# Patient Record
Sex: Female | Born: 1940 | Race: Black or African American | Hispanic: No | Smoking: Never smoker
Health system: Southern US, Community
[De-identification: ages and names within clinical notes are randomized; demographics above are authoritative.]

## PROBLEM LIST (undated history)

## (undated) DIAGNOSIS — M542 Cervicalgia: Secondary | ICD-10-CM

## (undated) DIAGNOSIS — E119 Type 2 diabetes mellitus without complications: Secondary | ICD-10-CM

## (undated) DIAGNOSIS — H409 Unspecified glaucoma: Secondary | ICD-10-CM

## (undated) DIAGNOSIS — I639 Cerebral infarction, unspecified: Secondary | ICD-10-CM

## (undated) DIAGNOSIS — L89892 Pressure ulcer of other site, stage 2: Secondary | ICD-10-CM

## (undated) DIAGNOSIS — Z993 Dependence on wheelchair: Secondary | ICD-10-CM

## (undated) DIAGNOSIS — I10 Essential (primary) hypertension: Secondary | ICD-10-CM

## (undated) DIAGNOSIS — G4700 Insomnia, unspecified: Secondary | ICD-10-CM

## (undated) DIAGNOSIS — N189 Chronic kidney disease, unspecified: Secondary | ICD-10-CM

## (undated) DIAGNOSIS — R32 Unspecified urinary incontinence: Secondary | ICD-10-CM

## (undated) DIAGNOSIS — R413 Other amnesia: Secondary | ICD-10-CM

## (undated) DIAGNOSIS — R300 Dysuria: Secondary | ICD-10-CM

## (undated) DIAGNOSIS — F0390 Unspecified dementia without behavioral disturbance: Secondary | ICD-10-CM

## (undated) DIAGNOSIS — R569 Unspecified convulsions: Secondary | ICD-10-CM

## (undated) DIAGNOSIS — K5900 Constipation, unspecified: Secondary | ICD-10-CM

## (undated) DIAGNOSIS — I2510 Atherosclerotic heart disease of native coronary artery without angina pectoris: Secondary | ICD-10-CM

## (undated) HISTORY — PX: CARDIAC SURGERY: SHX584

## (undated) HISTORY — PX: CORONARY ARTERY BYPASS GRAFT: SHX141

---

## 2015-03-21 ENCOUNTER — Emergency Department: Payer: Self-pay | Admitting: Emergency Medicine

## 2015-03-21 LAB — URINALYSIS, COMPLETE
Bilirubin,UR: NEGATIVE
Blood: NEGATIVE
GLUCOSE, UR: NEGATIVE mg/dL (ref 0–75)
Hyaline Cast: 2
KETONE: NEGATIVE
Nitrite: NEGATIVE
Ph: 6 (ref 4.5–8.0)
Protein: NEGATIVE
SPECIFIC GRAVITY: 1.012 (ref 1.003–1.030)

## 2015-03-21 LAB — COMPREHENSIVE METABOLIC PANEL
ALBUMIN: 3.5 g/dL
Alkaline Phosphatase: 52 U/L
Anion Gap: 8 (ref 7–16)
BUN: 19 mg/dL
Bilirubin,Total: 0.9 mg/dL
CO2: 29 mmol/L
Calcium, Total: 9.5 mg/dL
Chloride: 102 mmol/L
Creatinine: 1.02 mg/dL — ABNORMAL HIGH
EGFR (Non-African Amer.): 55 — ABNORMAL LOW
Glucose: 184 mg/dL — ABNORMAL HIGH
Potassium: 3.7 mmol/L
SGOT(AST): 20 U/L
SGPT (ALT): 16 U/L
Sodium: 139 mmol/L
Total Protein: 7.6 g/dL

## 2015-03-21 LAB — CBC WITH DIFFERENTIAL/PLATELET
BASOS PCT: 0.9 %
Basophil #: 0 10*3/uL (ref 0.0–0.1)
EOS PCT: 1.5 %
Eosinophil #: 0.1 10*3/uL (ref 0.0–0.7)
HCT: 45.7 % (ref 35.0–47.0)
HGB: 15 g/dL (ref 12.0–16.0)
LYMPHS ABS: 1.2 10*3/uL (ref 1.0–3.6)
Lymphocyte %: 28.2 %
MCH: 29.5 pg (ref 26.0–34.0)
MCHC: 32.9 g/dL (ref 32.0–36.0)
MCV: 90 fL (ref 80–100)
MONO ABS: 0.3 x10 3/mm (ref 0.2–0.9)
Monocyte %: 6.1 %
Neutrophil #: 2.7 10*3/uL (ref 1.4–6.5)
Neutrophil %: 63.3 %
PLATELETS: 206 10*3/uL (ref 150–440)
RBC: 5.09 10*6/uL (ref 3.80–5.20)
RDW: 15.1 % — ABNORMAL HIGH (ref 11.5–14.5)
WBC: 4.3 10*3/uL (ref 3.6–11.0)

## 2015-03-21 LAB — TROPONIN I

## 2015-03-23 LAB — URINE CULTURE

## 2015-05-11 ENCOUNTER — Other Ambulatory Visit: Payer: Self-pay | Admitting: Internal Medicine

## 2015-05-11 DIAGNOSIS — M25562 Pain in left knee: Secondary | ICD-10-CM

## 2015-07-22 ENCOUNTER — Emergency Department: Payer: Medicare Other

## 2015-07-22 ENCOUNTER — Emergency Department
Admission: EM | Admit: 2015-07-22 | Discharge: 2015-07-22 | Disposition: A | Discharge status: Home / Self Care | Payer: Medicare Other | Attending: Emergency Medicine | Admitting: Emergency Medicine

## 2015-07-22 ENCOUNTER — Other Ambulatory Visit: Payer: Self-pay

## 2015-07-22 DIAGNOSIS — F0390 Unspecified dementia without behavioral disturbance: Secondary | ICD-10-CM | POA: Diagnosis not present

## 2015-07-22 DIAGNOSIS — N390 Urinary tract infection, site not specified: Secondary | ICD-10-CM | POA: Insufficient documentation

## 2015-07-22 DIAGNOSIS — Z79899 Other long term (current) drug therapy: Secondary | ICD-10-CM | POA: Insufficient documentation

## 2015-07-22 DIAGNOSIS — R4182 Altered mental status, unspecified: Secondary | ICD-10-CM | POA: Diagnosis present

## 2015-07-22 DIAGNOSIS — E119 Type 2 diabetes mellitus without complications: Secondary | ICD-10-CM | POA: Insufficient documentation

## 2015-07-22 DIAGNOSIS — R319 Hematuria, unspecified: Secondary | ICD-10-CM

## 2015-07-22 HISTORY — DX: Type 2 diabetes mellitus without complications: E11.9

## 2015-07-22 HISTORY — DX: Essential (primary) hypertension: I10

## 2015-07-22 HISTORY — DX: Other amnesia: R41.3

## 2015-07-22 HISTORY — DX: Cerebral infarction, unspecified: I63.9

## 2015-07-22 HISTORY — DX: Unspecified glaucoma: H40.9

## 2015-07-22 HISTORY — DX: Cervicalgia: M54.2

## 2015-07-22 HISTORY — DX: Constipation, unspecified: K59.00

## 2015-07-22 HISTORY — DX: Unspecified dementia, unspecified severity, without behavioral disturbance, psychotic disturbance, mood disturbance, and anxiety: F03.90

## 2015-07-22 LAB — CBC WITH DIFFERENTIAL/PLATELET
BASOS PCT: 1 %
Basophils Absolute: 0 10*3/uL (ref 0–0.1)
Eosinophils Absolute: 0 10*3/uL (ref 0–0.7)
Eosinophils Relative: 1 %
HCT: 41 % (ref 35.0–47.0)
Hemoglobin: 13.6 g/dL (ref 12.0–16.0)
LYMPHS PCT: 43 %
Lymphs Abs: 2.2 10*3/uL (ref 1.0–3.6)
MCH: 29.5 pg (ref 26.0–34.0)
MCHC: 33.1 g/dL (ref 32.0–36.0)
MCV: 89.2 fL (ref 80.0–100.0)
MONO ABS: 0.4 10*3/uL (ref 0.2–0.9)
MONOS PCT: 8 %
Neutro Abs: 2.5 10*3/uL (ref 1.4–6.5)
Neutrophils Relative %: 47 %
Platelets: 190 10*3/uL (ref 150–440)
RBC: 4.59 MIL/uL (ref 3.80–5.20)
RDW: 13 % (ref 11.5–14.5)
WBC: 5.2 10*3/uL (ref 3.6–11.0)

## 2015-07-22 LAB — COMPREHENSIVE METABOLIC PANEL
ALBUMIN: 3.5 g/dL (ref 3.5–5.0)
ALK PHOS: 39 U/L (ref 38–126)
ALT: 25 U/L (ref 14–54)
AST: 25 U/L (ref 15–41)
Anion gap: 6 (ref 5–15)
BUN: 23 mg/dL — ABNORMAL HIGH (ref 6–20)
CALCIUM: 9.4 mg/dL (ref 8.9–10.3)
CHLORIDE: 104 mmol/L (ref 101–111)
CO2: 32 mmol/L (ref 22–32)
CREATININE: 0.99 mg/dL (ref 0.44–1.00)
GFR calc Af Amer: >60 mL/min (ref >60)
GFR, EST NON AFRICAN AMERICAN: 56 mL/min — AB (ref >60)
Glucose, Bld: 158 mg/dL — ABNORMAL HIGH (ref 65–99)
Potassium: 3.2 mmol/L — ABNORMAL LOW (ref 3.5–5.1)
Sodium: 142 mmol/L (ref 135–145)
Total Bilirubin: 0.4 mg/dL (ref 0.3–1.2)
Total Protein: 6.9 g/dL (ref 6.5–8.1)

## 2015-07-22 LAB — URINALYSIS COMPLETE WITH MICROSCOPIC (ARMC ONLY)
Bilirubin Urine: NEGATIVE
Glucose, UA: 50 mg/dL — AB
HGB URINE DIPSTICK: NEGATIVE
Ketones, ur: NEGATIVE mg/dL
Leukocytes, UA: NEGATIVE
NITRITE: NEGATIVE
PROTEIN: 100 mg/dL — AB
SPECIFIC GRAVITY, URINE: 1.019 (ref 1.005–1.030)
pH: 7 (ref 5.0–8.0)

## 2015-07-22 LAB — GLUCOSE, CAPILLARY: Glucose-Capillary: 99 mg/dL (ref 65–99)

## 2015-07-22 LAB — TROPONIN I: Troponin I: 0.03 ng/mL (ref <0.031)

## 2015-07-22 MED ORDER — CEPHALEXIN 500 MG PO CAPS
500.0000 mg | ORAL_CAPSULE | Freq: Once | ORAL | Status: AC
Start: 2015-07-22 — End: 2015-07-22
  Administered 2015-07-22: 500 mg via ORAL
  Filled 2015-07-22: qty 1

## 2015-07-22 MED ORDER — CEPHALEXIN 500 MG PO CAPS: 500.0000 mg | ORAL_CAPSULE | Freq: Three times a day (TID) | ORAL | Status: DC

## 2015-07-22 MED ORDER — SODIUM CHLORIDE 0.9 % IV BOLUS (SEPSIS)
1000.0000 mL | Freq: Once | INTRAVENOUS | Status: AC
  Administered 2015-07-22: 1000 mL via INTRAVENOUS

## 2015-07-22 NOTE — ED Notes (Signed)
Pt brought to ED via EMS  Per EMS, pt was found unresponsive in home.  Per EMS, pt CGB was 71, they gave 1/2 amp D50.  Recheck CBG was 68, they gave rest of amp, pt became responsive en route. Pt is oriented to self, but not place or time.

## 2015-07-22 NOTE — ED Provider Notes (Signed)
[facility] Emergency Department Provider Note  ____________________________________________  Time seen: 9:25 AM on arrival by EMS  I have reviewed the triage vital signs and the nursing notes.   HISTORY  Chief Complaint Altered Mental Status  altered mental status History Limited by patient altered mental status and somnolence  HPI Debra Hoffman is a 74 y.o. female who was found at her nursing home to be unarousable this morning. EMS was called and they confirm that they were unable to wake the patient up despite noxious stimuli. They checked a blood sugar which was 71 and they gave one half amp of D50, and on recheck the blood sugar was 68 so they gave another one half amp of D50 during transport. Just prior to arrival in the hospital and at that she did open her eyes and begin to respond. No known recent illness falls or trauma. EMS report that the family did tell them that the patient has a DO NOT RESUSCITATE directive although they're unable to locate paperwork confirming this at this time.  Patient denies any specific complaints at this time denies any chest pain shortness of breath abdominal pain headache or vision change. She reports pain at her IV insertion site.   Past Medical History  Diagnosis Date  . Diabetes mellitus without complication   . Cervicalgia   . Glaucoma   . Hypertension   . Dementia without behavioral disturbance   . Stroke   . Memory loss   . Constipation     There are no active problems to display for this patient.   Past Surgical History  Procedure Laterality Date  . Cardiac surgery      coronary artery bypass graft    Current Outpatient Rx  Name  Route  Sig  Dispense  Refill  . carvedilol (COREG) 12.5 MG tablet   Oral   Take 12.5 mg by mouth 2 (two) times daily with a meal.         . clopidogrel (PLAVIX) 75 MG tablet   Oral   Take 75 mg by mouth daily.         . divalproex (DEPAKOTE ER) 250 MG 24 hr  tablet   Oral   Take 250 mg by mouth 2 (two) times daily.         . divalproex (DEPAKOTE) 500 MG DR tablet   Oral   Take 500 mg by mouth 2 (two) times daily.         Marland Kitchen donepezil (ARICEPT) 5 MG tablet   Oral   Take 5 mg by mouth at bedtime.         . hydrochlorothiazide (HYDRODIURIL) 25 MG tablet   Oral   Take 25 mg by mouth daily.         . insulin NPH-regular Human (HUMULIN 70/30) (70-30) 100 UNIT/ML injection   Subcutaneous   Inject 15 Units into the skin daily with breakfast.         . naproxen (NAPROSYN) 250 MG tablet   Oral   Take 250 mg by mouth 2 (two) times daily with a meal.         . nitroGLYCERIN (NITROSTAT) 0.4 MG SL tablet   Sublingual   Place 0.4 mg under the tongue every 5 (five) minutes as needed for chest pain.         . pantoprazole (PROTONIX) 40 MG tablet   Oral   Take 40 mg by mouth daily.         Marland Kitchen  potassium citrate (UROCIT-K) 10 MEQ (1080 MG) SR tablet   Oral   Take 10 mEq by mouth 3 (three) times daily with meals.         . rosuvastatin (CRESTOR) 5 MG tablet   Oral   Take 5 mg by mouth daily.         . solifenacin (VESICARE) 10 MG tablet   Oral   Take 10 mg by mouth daily.         . traZODone (DESYREL) 150 MG tablet   Oral   Take 150 mg by mouth at bedtime.         . cephALEXin (KEFLEX) 500 MG capsule   Oral   Take 1 capsule (500 mg total) by mouth 3 (three) times daily.   21 capsule   0     Allergies Review of patient's allergies indicates no known allergies.  No family history on file.  Social History History  Substance Use Topics  . Smoking status: Never Smoker   . Smokeless tobacco: Not on file  . Alcohol Use: Not on file    Review of Systems Obtained per patient although history may be somewhat unreliable due to altered mental status Constitutional: No fever or chills. No weight changes Eyes:No blurry vision or double vision.  ENT: No sore throat. Cardiovascular: No chest pain. Respiratory:  No dyspnea or cough. Gastrointestinal: Negative for abdominal pain, vomiting and diarrhea.  No BRBPR or melena. Genitourinary: Negative for dysuria, urinary retention, bloody urine, or difficulty urinating. Musculoskeletal: Negative for back pain. No joint swelling or pain. Skin: Negative for rash. Neurological: Negative for headaches, focal weakness or numbness. Psychiatric:No anxiety or depression.   Endocrine:No hot/cold intolerance, changes in energy, or sleep difficulty.  10-point ROS otherwise negative.  ____________________________________________   PHYSICAL EXAM:  VITAL SIGNS: ED Triage Vitals  Enc Vitals Group     BP --      Pulse --      Resp --      Temp --      Temp src --      SpO2 --      Weight --      Height --      Head Cir --      Peak Flow --      Pain Score --      Pain Loc --      Pain Edu? --      Excl. in [place]? --      Constitutional: Somnolent, oriented to self. No acute distress. Eyes: No scleral icterus. No conjunctival pallor. PERRL. EOMI ENT   Head: Normocephalic and atraumatic.   Nose: No congestion/rhinnorhea. No septal hematoma   Mouth/Throat: Dry mucous membranes, no pharyngeal erythema. No peritonsillar mass. No uvula shift.   Neck: No stridor. No SubQ emphysema. No meningismus. Hematological/Lymphatic/Immunilogical: No cervical lymphadenopathy. Cardiovascular: RRR. Normal and symmetric distal pulses are present in all extremities. No murmurs, rubs, or gallops. Respiratory: Normal respiratory effort without tachypnea nor retractions. Breath sounds are clear and equal bilaterally. No wheezes/rales/rhonchi. Gastrointestinal: Soft and nontender. No distention. There is no CVA tenderness.  No rebound, rigidity, or guarding. Genitourinary: deferred Musculoskeletal: Nontender with normal range of motion in all extremities. No joint effusions.  No lower extremity tenderness.  No edema. Neurologic:   Mumbled speech, unclear if  this is baseline.  CN 2-10 normal. Motor grossly intact. Neurologic exam limited by patient participation due to current condition No gross focal neurologic deficits are appreciated.  Skin:  Skin is warm, dry and intact. No rash noted.  No petechiae, purpura, or bullae. Psychiatric: Mood and affect are normal. Speech and behavior are normal. Patient exhibits appropriate insight and judgment.  ____________________________________________    LABS (pertinent positives/negatives) (all labs ordered are listed, but only abnormal results are displayed) Labs Reviewed  COMPREHENSIVE METABOLIC PANEL - Abnormal; Notable for the following:    Potassium 3.2 (*)    Glucose, Bld 158 (*)    BUN 23 (*)    GFR calc non Af Amer 56 (*)    All other components within normal limits  URINALYSIS COMPLETEWITH MICROSCOPIC ([facility] ONLY) - Abnormal; Notable for the following:    Color, Urine AMBER (*)    APPearance TURBID (*)    Glucose, UA 50 (*)    Protein, ur 100 (*)    Bacteria, UA MANY (*)    Squamous Epithelial / LPF TOO NUMEROUS TO COUNT (*)    All other components within normal limits  URINE CULTURE  TROPONIN I  CBC WITH DIFFERENTIAL/PLATELET  GLUCOSE, CAPILLARY  CBG MONITORING, ED   urine white blood cells 5-30, urine red blood cells 5-30 ____________________________________________   EKG  EKG interpreted by me Sinus bradycardia rate 55, left axis normal intervals right bundle branch block normal ST segments. There are T-wave inversions in V2 and V3 in the context of right bundle branch block  ____________________________________________    RADIOLOGY  Chest x-ray unremarkable CT head unremarkable  ____________________________________________   PROCEDURES  ____________________________________________   INITIAL IMPRESSION / ASSESSMENT AND PLAN / ED COURSE  Pertinent labs & imaging results that were available during my care of the patient were reviewed by me and considered in my  medical decision making (see chart for details).  Concern for stroke versus hypoglycemia versus other acute illness such as infection. We'll check labs urinalysis chest x-ray CT head and follow blood sugars. We'll also give IV fluids for hydration. Will follow-up with family when arriving to help better assess which the patient's current features are chronic versus acute. ----------------------------------------- 1:22 PM on 07/22/2015 ----------------------------------------- Patient awake and alert, following commands, tolerating oral intake. Confirmed with daughters at bedside that the patient is back to baseline. Urinalysis does reveal evidence of a urinary tract infection. Sugars have been good in the ED since arrival. Appears to be an episode of mild hypoglycemia related to urinary tract infection, we'll give the patient Keflex now and write prescription for a seven-day course and have her follow up with primary care. Culture sent. No evidence of sepsis stroke or intracranial hemorrhage.  ____________________________________________   FINAL CLINICAL IMPRESSION(S) / ED DIAGNOSES  Final diagnoses:  Urinary tract infection with hematuria, site unspecified      Carrie Mew, MD 07/22/15 1323

## 2015-07-22 NOTE — Discharge Instructions (Signed)

## 2015-07-25 LAB — URINE CULTURE: CULTURE: NO GROWTH

## 2015-10-22 ENCOUNTER — Emergency Department: Payer: Medicare Other

## 2015-10-22 ENCOUNTER — Inpatient Hospital Stay
Admission: EM | Admit: 2015-10-22 | Discharge: 2015-10-27 | DRG: 871 | Disposition: A | Discharge status: Home Health | Payer: Medicare Other | Attending: Internal Medicine | Admitting: Internal Medicine

## 2015-10-22 ENCOUNTER — Inpatient Hospital Stay: Payer: Medicare Other

## 2015-10-22 ENCOUNTER — Encounter: Payer: Self-pay | Admitting: Emergency Medicine

## 2015-10-22 DIAGNOSIS — I959 Hypotension, unspecified: Secondary | ICD-10-CM | POA: Diagnosis present

## 2015-10-22 DIAGNOSIS — N289 Disorder of kidney and ureter, unspecified: Secondary | ICD-10-CM | POA: Diagnosis present

## 2015-10-22 DIAGNOSIS — I69391 Dysphagia following cerebral infarction: Secondary | ICD-10-CM | POA: Diagnosis not present

## 2015-10-22 DIAGNOSIS — I248 Other forms of acute ischemic heart disease: Secondary | ICD-10-CM | POA: Diagnosis present

## 2015-10-22 DIAGNOSIS — R4182 Altered mental status, unspecified: Secondary | ICD-10-CM

## 2015-10-22 DIAGNOSIS — R569 Unspecified convulsions: Secondary | ICD-10-CM | POA: Diagnosis present

## 2015-10-22 DIAGNOSIS — E876 Hypokalemia: Secondary | ICD-10-CM | POA: Diagnosis present

## 2015-10-22 DIAGNOSIS — G9341 Metabolic encephalopathy: Secondary | ICD-10-CM | POA: Diagnosis present

## 2015-10-22 DIAGNOSIS — H409 Unspecified glaucoma: Secondary | ICD-10-CM | POA: Diagnosis present

## 2015-10-22 DIAGNOSIS — R7989 Other specified abnormal findings of blood chemistry: Secondary | ICD-10-CM

## 2015-10-22 DIAGNOSIS — D649 Anemia, unspecified: Secondary | ICD-10-CM | POA: Diagnosis present

## 2015-10-22 DIAGNOSIS — R32 Unspecified urinary incontinence: Secondary | ICD-10-CM | POA: Diagnosis present

## 2015-10-22 DIAGNOSIS — M109 Gout, unspecified: Secondary | ICD-10-CM | POA: Diagnosis present

## 2015-10-22 DIAGNOSIS — G9340 Encephalopathy, unspecified: Secondary | ICD-10-CM | POA: Diagnosis not present

## 2015-10-22 DIAGNOSIS — E785 Hyperlipidemia, unspecified: Secondary | ICD-10-CM | POA: Diagnosis present

## 2015-10-22 DIAGNOSIS — Z993 Dependence on wheelchair: Secondary | ICD-10-CM

## 2015-10-22 DIAGNOSIS — R8281 Pyuria: Secondary | ICD-10-CM

## 2015-10-22 DIAGNOSIS — A419 Sepsis, unspecified organism: Secondary | ICD-10-CM | POA: Diagnosis present

## 2015-10-22 DIAGNOSIS — J9601 Acute respiratory failure with hypoxia: Secondary | ICD-10-CM

## 2015-10-22 DIAGNOSIS — I2510 Atherosclerotic heart disease of native coronary artery without angina pectoris: Secondary | ICD-10-CM | POA: Diagnosis present

## 2015-10-22 DIAGNOSIS — R1310 Dysphagia, unspecified: Secondary | ICD-10-CM

## 2015-10-22 DIAGNOSIS — F0390 Unspecified dementia without behavioral disturbance: Secondary | ICD-10-CM | POA: Diagnosis present

## 2015-10-22 DIAGNOSIS — R29810 Facial weakness: Secondary | ICD-10-CM | POA: Diagnosis present

## 2015-10-22 DIAGNOSIS — Z951 Presence of aortocoronary bypass graft: Secondary | ICD-10-CM

## 2015-10-22 DIAGNOSIS — R778 Other specified abnormalities of plasma proteins: Secondary | ICD-10-CM

## 2015-10-22 DIAGNOSIS — Z7984 Long term (current) use of oral hypoglycemic drugs: Secondary | ICD-10-CM | POA: Diagnosis not present

## 2015-10-22 DIAGNOSIS — E1165 Type 2 diabetes mellitus with hyperglycemia: Secondary | ICD-10-CM | POA: Diagnosis present

## 2015-10-22 DIAGNOSIS — N281 Cyst of kidney, acquired: Secondary | ICD-10-CM | POA: Diagnosis present

## 2015-10-22 DIAGNOSIS — I69392 Facial weakness following cerebral infarction: Secondary | ICD-10-CM

## 2015-10-22 DIAGNOSIS — I10 Essential (primary) hypertension: Secondary | ICD-10-CM | POA: Diagnosis present

## 2015-10-22 DIAGNOSIS — E119 Type 2 diabetes mellitus without complications: Secondary | ICD-10-CM

## 2015-10-22 DIAGNOSIS — J9690 Respiratory failure, unspecified, unspecified whether with hypoxia or hypercapnia: Secondary | ICD-10-CM

## 2015-10-22 DIAGNOSIS — E279 Disorder of adrenal gland, unspecified: Secondary | ICD-10-CM | POA: Diagnosis present

## 2015-10-22 DIAGNOSIS — I69354 Hemiplegia and hemiparesis following cerebral infarction affecting left non-dominant side: Secondary | ICD-10-CM

## 2015-10-22 DIAGNOSIS — D181 Lymphangioma, any site: Secondary | ICD-10-CM | POA: Diagnosis present

## 2015-10-22 DIAGNOSIS — J189 Pneumonia, unspecified organism: Secondary | ICD-10-CM | POA: Diagnosis present

## 2015-10-22 DIAGNOSIS — Z79899 Other long term (current) drug therapy: Secondary | ICD-10-CM | POA: Diagnosis not present

## 2015-10-22 DIAGNOSIS — Z4659 Encounter for fitting and adjustment of other gastrointestinal appliance and device: Secondary | ICD-10-CM

## 2015-10-22 DIAGNOSIS — Z7902 Long term (current) use of antithrombotics/antiplatelets: Secondary | ICD-10-CM

## 2015-10-22 DIAGNOSIS — J9600 Acute respiratory failure, unspecified whether with hypoxia or hypercapnia: Secondary | ICD-10-CM | POA: Diagnosis not present

## 2015-10-22 DIAGNOSIS — R9389 Abnormal findings on diagnostic imaging of other specified body structures: Secondary | ICD-10-CM

## 2015-10-22 DIAGNOSIS — R531 Weakness: Secondary | ICD-10-CM

## 2015-10-22 DIAGNOSIS — D72829 Elevated white blood cell count, unspecified: Secondary | ICD-10-CM

## 2015-10-22 HISTORY — DX: Dysuria: R30.0

## 2015-10-22 HISTORY — DX: Pressure ulcer of other site, stage 2: L89.892

## 2015-10-22 HISTORY — DX: Unspecified convulsions: R56.9

## 2015-10-22 HISTORY — DX: Dependence on wheelchair: Z99.3

## 2015-10-22 HISTORY — DX: Unspecified urinary incontinence: R32

## 2015-10-22 HISTORY — DX: Insomnia, unspecified: G47.00

## 2015-10-22 HISTORY — DX: Atherosclerotic heart disease of native coronary artery without angina pectoris: I25.10

## 2015-10-22 LAB — BLOOD GAS, ARTERIAL
ACID-BASE EXCESS: 7 mmol/L — AB (ref 0.0–3.0)
Bicarbonate: 30.2 mEq/L — ABNORMAL HIGH (ref 21.0–28.0)
Delivery systems: POSITIVE
Expiratory PAP: 5
FIO2: 50
INSPIRATORY PAP: 14
O2 Saturation: 98.6 %
PCO2 ART: 37 mmHg (ref 32.0–48.0)
PH ART: 7.52 — AB (ref 7.350–7.450)
PO2 ART: 106 mmHg (ref 83.0–108.0)
Patient temperature: 37
RATE: 10 resp/min

## 2015-10-22 LAB — URINALYSIS COMPLETE WITH MICROSCOPIC (ARMC ONLY)
BILIRUBIN URINE: NEGATIVE
Bacteria, UA: NONE SEEN
Glucose, UA: 50 mg/dL — AB
Leukocytes, UA: NEGATIVE
NITRITE: NEGATIVE
Protein, ur: 100 mg/dL — AB
Specific Gravity, Urine: 1.027 (ref 1.005–1.030)
pH: 5 (ref 5.0–8.0)

## 2015-10-22 LAB — CBC WITH DIFFERENTIAL/PLATELET
BASOS PCT: 1 %
Basophils Absolute: 0.1 10*3/uL (ref 0–0.1)
EOS ABS: 0 10*3/uL (ref 0–0.7)
EOS PCT: 0 %
HCT: 39.3 % (ref 35.0–47.0)
Hemoglobin: 13.5 g/dL (ref 12.0–16.0)
Lymphocytes Relative: 8 %
Lymphs Abs: 0.9 10*3/uL — ABNORMAL LOW (ref 1.0–3.6)
MCH: 31.3 pg (ref 26.0–34.0)
MCHC: 34.2 g/dL (ref 32.0–36.0)
MCV: 91.4 fL (ref 80.0–100.0)
MONO ABS: 1.2 10*3/uL — AB (ref 0.2–0.9)
MONOS PCT: 11 %
NEUTROS PCT: 80 %
Neutro Abs: 8.9 10*3/uL — ABNORMAL HIGH (ref 1.4–6.5)
PLATELETS: 195 10*3/uL (ref 150–440)
RBC: 4.3 MIL/uL (ref 3.80–5.20)
RDW: 14.4 % (ref 11.5–14.5)
WBC: 11.1 10*3/uL — ABNORMAL HIGH (ref 3.6–11.0)

## 2015-10-22 LAB — COMPREHENSIVE METABOLIC PANEL
ALT: 14 U/L (ref 14–54)
AST: 28 U/L (ref 15–41)
Albumin: 2.8 g/dL — ABNORMAL LOW (ref 3.5–5.0)
Alkaline Phosphatase: 58 U/L (ref 38–126)
Anion gap: 12 (ref 5–15)
BUN: 37 mg/dL — ABNORMAL HIGH (ref 6–20)
CALCIUM: 9.3 mg/dL (ref 8.9–10.3)
CHLORIDE: 102 mmol/L (ref 101–111)
CO2: 26 mmol/L (ref 22–32)
CREATININE: 1.2 mg/dL — AB (ref 0.44–1.00)
GFR, EST AFRICAN AMERICAN: 51 mL/min — AB (ref >60)
GFR, EST NON AFRICAN AMERICAN: 44 mL/min — AB (ref >60)
Glucose, Bld: 140 mg/dL — ABNORMAL HIGH (ref 65–99)
Potassium: 3.9 mmol/L (ref 3.5–5.1)
Sodium: 140 mmol/L (ref 135–145)
Total Bilirubin: 1.2 mg/dL (ref 0.3–1.2)
Total Protein: 6.8 g/dL (ref 6.5–8.1)

## 2015-10-22 LAB — TROPONIN I
TROPONIN I: 0.29 ng/mL — AB (ref <0.031)
TROPONIN I: 0.04 ng/mL — AB (ref <0.031)
Troponin I: 0.03 ng/mL (ref <0.031)

## 2015-10-22 LAB — GLUCOSE, CAPILLARY: Glucose-Capillary: 125 mg/dL — ABNORMAL HIGH (ref 65–99)

## 2015-10-22 LAB — LACTIC ACID, PLASMA
LACTIC ACID, VENOUS: 1.7 mmol/L (ref 0.5–2.0)
Lactic Acid, Venous: 2 mmol/L (ref 0.5–2.0)

## 2015-10-22 LAB — VALPROIC ACID LEVEL: VALPROIC ACID LVL: 55 ug/mL (ref 50.0–100.0)

## 2015-10-22 LAB — MRSA PCR SCREENING: MRSA by PCR: NEGATIVE

## 2015-10-22 MED ORDER — LEVETIRACETAM 500 MG PO TABS: 500.0000 mg | ORAL_TABLET | Freq: Two times a day (BID) | ORAL | Status: DC

## 2015-10-22 MED ORDER — MIDAZOLAM HCL 5 MG/5ML IJ SOLN
2.0000 mg | Freq: Once | INTRAMUSCULAR | Status: AC
  Administered 2015-10-22: 2 mg via INTRAVENOUS

## 2015-10-22 MED ORDER — CLOTRIMAZOLE 1 % EX CREA
1.0000 "application " | TOPICAL_CREAM | Freq: Two times a day (BID) | CUTANEOUS | Status: DC
  Administered 2015-10-23 – 2015-10-27 (×9): 1 via TOPICAL
  Filled 2015-10-22: qty 15

## 2015-10-22 MED ORDER — MIDAZOLAM HCL 5 MG/5ML IJ SOLN
2.0000 mg | Freq: Once | INTRAMUSCULAR | Status: AC
Start: 2015-10-22 — End: 2015-10-22
  Administered 2015-10-22: 2 mg via INTRAVENOUS

## 2015-10-22 MED ORDER — ONDANSETRON HCL 4 MG PO TABS: 4.0000 mg | ORAL_TABLET | Freq: Four times a day (QID) | ORAL | Status: DC | PRN

## 2015-10-22 MED ORDER — BRINZOLAMIDE 1 % OP SUSP
1.0000 [drp] | Freq: Three times a day (TID) | OPHTHALMIC | Status: DC
  Administered 2015-10-22 – 2015-10-27 (×14): 1 [drp] via OPHTHALMIC
  Filled 2015-10-22: qty 10

## 2015-10-22 MED ORDER — CARVEDILOL 12.5 MG PO TABS: 12.5000 mg | ORAL_TABLET | Freq: Two times a day (BID) | ORAL | Status: DC

## 2015-10-22 MED ORDER — IOHEXOL 300 MG/ML  SOLN: 75.0000 mL | Freq: Once | INTRAMUSCULAR | Status: DC | PRN

## 2015-10-22 MED ORDER — ACETAMINOPHEN 325 MG PO TABS: 650.0000 mg | ORAL_TABLET | Freq: Four times a day (QID) | ORAL | Status: DC | PRN

## 2015-10-22 MED ORDER — PANTOPRAZOLE SODIUM 40 MG IV SOLR
40.0000 mg | INTRAVENOUS | Status: DC
  Administered 2015-10-23: 40 mg via INTRAVENOUS
  Filled 2015-10-22: qty 40

## 2015-10-22 MED ORDER — ROCURONIUM BROMIDE 50 MG/5ML IV SOLN
1.0000 mg/kg | Freq: Once | INTRAVENOUS | Status: AC
  Administered 2015-10-22: 67.2 mg via INTRAVENOUS
  Filled 2015-10-22: qty 6.72

## 2015-10-22 MED ORDER — POTASSIUM CHLORIDE CRYS ER 10 MEQ PO TBCR: 10.0000 meq | EXTENDED_RELEASE_TABLET | Freq: Every day | ORAL | Status: DC

## 2015-10-22 MED ORDER — PSYLLIUM 95 % PO PACK: 1.0000 | PACK | Freq: Every day | ORAL | Status: DC

## 2015-10-22 MED ORDER — SODIUM CHLORIDE 0.9 % IV BOLUS (SEPSIS)
1000.0000 mL | Freq: Once | INTRAVENOUS | Status: AC
  Administered 2015-10-22: 1000 mL via INTRAVENOUS

## 2015-10-22 MED ORDER — ASPIRIN 300 MG RE SUPP
300.0000 mg | Freq: Once | RECTAL | Status: AC
  Administered 2015-10-22: 300 mg via RECTAL
  Filled 2015-10-22: qty 1

## 2015-10-22 MED ORDER — ASPIRIN 300 MG RE SUPP: 300.0000 mg | Freq: Every day | RECTAL | Status: DC

## 2015-10-22 MED ORDER — CLOPIDOGREL BISULFATE 75 MG PO TABS
75.0000 mg | ORAL_TABLET | Freq: Every day | ORAL | Status: DC
  Administered 2015-10-22: 75 mg via ORAL
  Filled 2015-10-22: qty 1

## 2015-10-22 MED ORDER — ROSUVASTATIN CALCIUM 10 MG PO TABS
20.0000 mg | ORAL_TABLET | Freq: Every day | ORAL | Status: DC
  Administered 2015-10-22: 20 mg via ORAL
  Filled 2015-10-22: qty 2

## 2015-10-22 MED ORDER — DEXTROSE 5 % IV SOLN
500.0000 mg | INTRAVENOUS | Status: DC
  Administered 2015-10-22 – 2015-10-24 (×3): 500 mg via INTRAVENOUS
  Filled 2015-10-22 (×4): qty 500

## 2015-10-22 MED ORDER — PROPOFOL 1000 MG/100ML IV EMUL
5.0000 ug/kg/min | INTRAVENOUS | Status: DC
  Administered 2015-10-22: 10 ug/kg/min via INTRAVENOUS

## 2015-10-22 MED ORDER — ENOXAPARIN SODIUM 40 MG/0.4ML ~~LOC~~ SOLN
40.0000 mg | Freq: Every day | SUBCUTANEOUS | Status: DC
  Administered 2015-10-22: 40 mg via SUBCUTANEOUS
  Filled 2015-10-22: qty 0.4

## 2015-10-22 MED ORDER — POTASSIUM CHLORIDE 20 MEQ/15ML (10%) PO SOLN: 10.0000 meq | Freq: Every day | ORAL | Status: DC

## 2015-10-22 MED ORDER — SODIUM CHLORIDE 0.9 % IJ SOLN
3.0000 mL | Freq: Two times a day (BID) | INTRAMUSCULAR | Status: DC
  Administered 2015-10-23 – 2015-10-26 (×6): 3 mL via INTRAVENOUS

## 2015-10-22 MED ORDER — ACETAMINOPHEN 650 MG RE SUPP: 650.0000 mg | Freq: Four times a day (QID) | RECTAL | Status: DC | PRN

## 2015-10-22 MED ORDER — FENTANYL CITRATE (PF) 100 MCG/2ML IJ SOLN: 50.0000 ug | INTRAMUSCULAR | Status: DC | PRN

## 2015-10-22 MED ORDER — METFORMIN HCL 500 MG PO TABS: 500.0000 mg | ORAL_TABLET | Freq: Every day | ORAL | Status: DC

## 2015-10-22 MED ORDER — INSULIN ASPART 100 UNIT/ML ~~LOC~~ SOLN: 0.0000 [IU] | Freq: Every day | SUBCUTANEOUS | Status: DC

## 2015-10-22 MED ORDER — PROPOFOL 1000 MG/100ML IV EMUL
INTRAVENOUS | Status: AC
  Filled 2015-10-22: qty 100

## 2015-10-22 MED ORDER — DIVALPROEX SODIUM 250 MG PO DR TAB: 250.0000 mg | DELAYED_RELEASE_TABLET | Freq: Two times a day (BID) | ORAL | Status: DC

## 2015-10-22 MED ORDER — INSULIN ASPART 100 UNIT/ML ~~LOC~~ SOLN: 0.0000 [IU] | Freq: Three times a day (TID) | SUBCUTANEOUS | Status: DC

## 2015-10-22 MED ORDER — DARIFENACIN HYDROBROMIDE ER 15 MG PO TB24
15.0000 mg | ORAL_TABLET | Freq: Every day | ORAL | Status: DC
  Filled 2015-10-22 (×2): qty 1

## 2015-10-22 MED ORDER — PANTOPRAZOLE SODIUM 40 MG PO TBEC: 40.0000 mg | DELAYED_RELEASE_TABLET | Freq: Every day | ORAL | Status: DC

## 2015-10-22 MED ORDER — SODIUM CHLORIDE 0.9 % IV SOLN
500.0000 mg | Freq: Two times a day (BID) | INTRAVENOUS | Status: DC
  Administered 2015-10-22 – 2015-10-25 (×6): 500 mg via INTRAVENOUS
  Filled 2015-10-22 (×8): qty 5

## 2015-10-22 MED ORDER — LEVOFLOXACIN IN D5W 750 MG/150ML IV SOLN
750.0000 mg | Freq: Once | INTRAVENOUS | Status: AC
  Administered 2015-10-22: 750 mg via INTRAVENOUS
  Filled 2015-10-22: qty 150

## 2015-10-22 MED ORDER — INSULIN ASPART 100 UNIT/ML ~~LOC~~ SOLN: 4.0000 [IU] | Freq: Three times a day (TID) | SUBCUTANEOUS | Status: DC

## 2015-10-22 MED ORDER — METOPROLOL TARTRATE 1 MG/ML IV SOLN
2.5000 mg | Freq: Three times a day (TID) | INTRAVENOUS | Status: DC
  Administered 2015-10-22 – 2015-10-25 (×8): 2.5 mg via INTRAVENOUS
  Filled 2015-10-22 (×10): qty 5

## 2015-10-22 MED ORDER — INSULIN ASPART 100 UNIT/ML ~~LOC~~ SOLN
0.0000 [IU] | SUBCUTANEOUS | Status: DC
  Administered 2015-10-23: 2 [IU] via SUBCUTANEOUS
  Filled 2015-10-22: qty 2

## 2015-10-22 MED ORDER — POLYETHYLENE GLYCOL 3350 17 G PO PACK: 17.0000 g | PACK | Freq: Every day | ORAL | Status: DC | PRN

## 2015-10-22 MED ORDER — NITROGLYCERIN 2 % TD OINT
0.5000 [in_us] | TOPICAL_OINTMENT | Freq: Three times a day (TID) | TRANSDERMAL | Status: DC
  Administered 2015-10-22 – 2015-10-25 (×9): 0.5 [in_us] via TOPICAL
  Filled 2015-10-22 (×9): qty 1

## 2015-10-22 MED ORDER — PROPOFOL 1000 MG/100ML IV EMUL: 0.0000 ug/kg/min | INTRAVENOUS | Status: DC

## 2015-10-22 MED ORDER — SODIUM CHLORIDE 0.9 % IV SOLN
INTRAVENOUS | Status: DC
  Administered 2015-10-22 – 2015-10-23 (×2): via INTRAVENOUS

## 2015-10-22 MED ORDER — DEXTROSE 5 % IV SOLN
1.0000 g | INTRAVENOUS | Status: DC
  Administered 2015-10-22 – 2015-10-23 (×2): 1 g via INTRAVENOUS
  Filled 2015-10-22 (×3): qty 10

## 2015-10-22 MED ORDER — DONEPEZIL HCL 5 MG PO TABS
5.0000 mg | ORAL_TABLET | Freq: Every day | ORAL | Status: DC
  Administered 2015-10-22: 5 mg via ORAL
  Filled 2015-10-22: qty 1

## 2015-10-22 MED ORDER — ONDANSETRON HCL 4 MG/2ML IJ SOLN: 4.0000 mg | Freq: Four times a day (QID) | INTRAMUSCULAR | Status: DC | PRN

## 2015-10-22 NOTE — ED Provider Notes (Addendum)
[facility] Emergency Department Provider Note  ____________________________________________  Time seen: Approximately 12:15 PM  I have reviewed the triage vital signs and the nursing notes.   HISTORY  Chief Complaint Altered Mental Status  Chief complaint is altered mental status  HPI Debra Hoffman is a 74 y.o.  patient sent from home by family family reported to EMS that patient seemed to be much less talkative than usual and altered in mental status. Patient's face seemed a little bit more droopy than previously. Patient has had a stroke in the past but is usually awake and talkative from what I understand. EMS reports a bad deep cough. Temperature was 99 axillary blood pressure was 90 systolic. Stick was 160 per EMS. I am unable to get any further history from the patient as the speech is very slurry.   Past Medical History  Diagnosis Date  . Dysuria   . Seizures ([place])   . Insomnia   . Wheelchair dependence   . Pressure ulcer of foot, stage 2   . Urinary incontinence   . Coronary artery disease   . Stroke ([place])   . Hypertension   . Glaucoma    Past medical history includes stroke There are no active problems to display for this patient.   Past Surgical History  Procedure Laterality Date  . Cardiac surgery    . Coronary artery bypass graft      Current Outpatient Rx  Name  Route  Sig  Dispense  Refill  . brinzolamide (AZOPT) 1 % ophthalmic suspension   Both Eyes   Place 1 drop into both eyes 3 (three) times daily.         . carvedilol (COREG) 12.5 MG tablet   Oral   Take 12.5 mg by mouth 2 (two) times daily.         . clopidogrel (PLAVIX) 75 MG tablet   Oral   Take 75 mg by mouth daily.         . clotrimazole (LOTRIMIN) 1 % cream   Topical   Apply 1 application topically 2 (two) times daily. For 7-14 days         . divalproex (DEPAKOTE) 250 MG DR tablet   Oral   Take 250 mg by mouth 2 (two) times daily.         Marland Kitchen  docusate sodium (COLACE) 100 MG capsule   Oral   Take 100 mg by mouth daily.         Marland Kitchen donepezil (ARICEPT) 5 MG tablet   Oral   Take 5 mg by mouth at bedtime.         Marland Kitchen esomeprazole (NEXIUM) 20 MG capsule   Oral   Take 20 mg by mouth daily at 12 noon.         . hydrochlorothiazide (MICROZIDE) 12.5 MG capsule   Oral   Take 12.5 mg by mouth daily.         Marland Kitchen levETIRAcetam (KEPPRA) 500 MG tablet   Oral   Take 500 mg by mouth 2 (two) times daily.         . metFORMIN (GLUCOPHAGE) 500 MG tablet   Oral   Take 500 mg by mouth daily.         . naproxen (NAPROSYN) 250 MG tablet   Oral   Take 250 mg by mouth 2 (two) times daily as needed for mild pain, moderate pain or headache.         . nitroGLYCERIN (  NITROSTAT) 0.4 MG SL tablet   Sublingual   Place 0.4 mg under the tongue every 5 (five) minutes as needed for chest pain.         . polyethylene glycol (MIRALAX / GLYCOLAX) packet   Oral   Take 17 g by mouth daily as needed for mild constipation, moderate constipation or severe constipation.         . potassium citrate (UROCIT-K) 10 MEQ (1080 MG) SR tablet   Oral   Take 10 mEq by mouth daily.         . psyllium (METAMUCIL SMOOTH TEXTURE) 28 % packet   Oral   Take 1 packet by mouth See admin instructions. One to two times a day as needed for constipation         . rosuvastatin (CRESTOR) 20 MG tablet   Oral   Take 20 mg by mouth at bedtime.         . solifenacin (VESICARE) 10 MG tablet   Oral   Take 10 mg by mouth daily.         . traZODone (DESYREL) 150 MG tablet   Oral   Take 150 mg by mouth at bedtime.           Allergies Shellfish allergy  No family history on file.  Social History Social History  Substance Use Topics  . Smoking status: Unknown If Ever Smoked  . Smokeless tobacco: None  . Alcohol Use: None    Review of Systems Unable to obtain due to patient's mental  status  ____________________________________________   PHYSICAL EXAM:  VITAL SIGNS: ED Triage Vitals  Enc Vitals Group     BP --      Pulse --      Resp --      Temp --      Temp src --      SpO2 --      Weight --      Height --      Head Cir --      Peak Flow --      Pain Score --      Pain Loc --      Pain Edu? --      Excl. in [place]? --     Constitutional: Awake very slurry speech and sided facial droop patient does not follow commands. Does mumble in response to questions and does indicate that there is no abdominal pain on palpation Eyes: Patient will not open eyes and will not allow me to open her eyes Head: Atraumatic. Nose: No congestion/rhinnorhea. Mouth/Throat: Mucous membranes are moist.  Oropharynx non-erythematous. Neck: No stridor.   Cardiovascular: Normal rate, regular rhythm. Grossly normal heart sounds.  Good peripheral circulation. Respiratory: Normal respiratory effort.  No retractions. Crackles bilaterally in the lungs Gastrointestinal: Soft and nontender. No distention. No abdominal bruits. No CVA tenderness. Musculoskeletal: No lower extremity tenderness nor edema.  No joint effusions. Neurologic:  Slurry speech and right-sided facial droop patient is not moving either arm very well as a very very weak grip not sure if the patient understands the command to squeeze my hands. Patient is not moving legs either. Skin:  Skin is warm, dry and intact. No rash noted.   ____________________________________________   LABS (all labs ordered are listed, but only abnormal results are displayed)  Labs Reviewed  COMPREHENSIVE METABOLIC PANEL - Abnormal; Notable for the following:    Glucose, Bld 140 (*)    BUN 37 (*)  Creatinine, Ser 1.20 (*)    Albumin 2.8 (*)    GFR calc non Af Amer 44 (*)    GFR calc Af Amer 51 (*)    All other components within normal limits  TROPONIN I - Abnormal; Notable for the following:    Troponin I 0.29 (*)    All other  components within normal limits  CBC WITH DIFFERENTIAL/PLATELET - Abnormal; Notable for the following:    WBC 11.1 (*)    Neutro Abs 8.9 (*)    Lymphs Abs 0.9 (*)    Monocytes Absolute 1.2 (*)    All other components within normal limits  URINALYSIS COMPLETEWITH MICROSCOPIC ([facility] ONLY) - Abnormal; Notable for the following:    Color, Urine AMBER (*)    APPearance CLOUDY (*)    Glucose, UA 50 (*)    Ketones, ur 1+ (*)    Hgb urine dipstick 1+ (*)    Protein, ur 100 (*)    Squamous Epithelial / LPF 0-5 (*)    All other components within normal limits  URINE CULTURE  LACTIC ACID, PLASMA  LACTIC ACID, PLASMA   ____________________________________________  EKG EKG is not currently available but as I remember it and it wasn't right bundle branch block sinus rhythm no acute changes no EKG she said her old are available for comparison  ____________________________________________  RADIOLOGY Chest x-ray shows haziness in the right side radiologist is uncertain if there is an infiltrate or mass. 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  Patient's family comes and I discussed the patient with them in the back home again. Patient's family left phone number. Patient will be admitted. Critical care time including reviewing the labs x-rays and examining the patient talking to the patient's family talking to the hospital lists 45  minutes ____________________________________________   FINAL CLINICAL IMPRESSION(S) / ED DIAGNOSES  Final diagnoses:  Community acquired pneumonia  Elevated troponin  Altered mental status, unspecified altered mental status type      Nena Polio, MD 10/22/15 1611  Hospitalist sees the patient talks in the family finds the patient has no gag reflex. Which I have not checked at until that time asked me to intubate the patient patient is arousable I sedate her with 2 mg of Versed IV and another 2 mg of Versed IV patient is at this point appears to be unconscious I give her Roxie Runyan IV patient is intubated under direct vision with a 7.0 tube with no difficulty. She has good breath sounds bilaterally and none in the stomach. Color change on the colorimeter is positive for proper placement will get a chest x-ray  Nena Polio, MD 10/22/15 [phone]

## 2015-10-22 NOTE — H&P (Signed)
[facility] [place] NAME: Debra Hoffman    MR#:  [id number]  DATE OF BIRTH:  22-Oct-1941  DATE OF ADMISSION:  10/22/2015  PRIMARY CARE PHYSICIAN: No primary care provider on file.   REQUESTING/REFERRING PHYSICIAN:   CHIEF COMPLAINT:   Chief Complaint  Patient presents with  . Altered Mental Status    HISTORY OF PRESENT ILLNESS: Debra Hoffman  is a 74 y.o. female with a known history of diabetes, dementia, seizure disorder, hyperlipidemia, hypertension, gout, stroke, which left her with left-sided weakness and dysphagia presents from home with altered mental status, dry cough, increasing weakness and right facial droop. Apparently she was doing well up until a week at all so ago when she started having problems with poor appetite. She would not eat and drink of fluids. She was seen by her primary care physician and sent home. Today, however, she was noted to have altered mental status. She was hypotensive at home with systolic blood pressure in 90s and febrile with temperature 101.6 rectally, per patient's granddaughter [place]. She was coughing and had increased weakness, right facial droop was noted by family. She was brought to emergency room where she was noted to be hypoxic and confused. She was initiated on BiPAP with improvement of her oxygenation, however, remained tachypneic with respiration rate of close to 30s despite BiPAP. Labs revealed renal insufficiency with right now 1.2, hyperglycemia, elevated troponin and elevated white blood cell count. Urinalysis revealed pyuria. Chest x-ray revealed right infrahilar density concerning for mass versus pneumonia. CT of head showed atrophy, chronic subdural hygromas. CT chest showed multifocal pneumonia. Hospitalist services were contacted for admission. Per patient's granddaughter, patient is full code  PAST MEDICAL HISTORY:   Past Medical History  Diagnosis Date  . Dysuria   .  Seizures ([place])   . Insomnia   . Wheelchair dependence   . Pressure ulcer of foot, stage 2   . Urinary incontinence   . Coronary artery disease   . Stroke ([place])   . Hypertension   . Glaucoma     PAST SURGICAL HISTORY:  Past Surgical History  Procedure Laterality Date  . Cardiac surgery    . Coronary artery bypass graft      SOCIAL HISTORY:  Social History  Substance Use Topics  . Smoking status: Unknown If Ever Smoked  . Smokeless tobacco: Not on file  . Alcohol Use: Not on file    FAMILY HISTORY: Patient is unable to provide  DRUG ALLERGIES:  Allergies  Allergen Reactions  . Shellfish Allergy     Reaction: unknown    Review of Systems  Unable to perform ROS: acuity of condition    MEDICATIONS AT HOME:  Prior to Admission medications   Medication Sig Start Date End Date Taking? Authorizing Provider  brinzolamide (AZOPT) 1 % ophthalmic suspension Place 1 drop into both eyes 3 (three) times daily.   Yes Historical Provider, MD  carvedilol (COREG) 12.5 MG tablet Take 12.5 mg by mouth 2 (two) times daily.   Yes Historical Provider, MD  clopidogrel (PLAVIX) 75 MG tablet Take 75 mg by mouth daily.   Yes Historical Provider, MD  clotrimazole (LOTRIMIN) 1 % cream Apply 1 application topically 2 (two) times daily. For 7-14 days 10/19/15  Yes Historical Provider, MD  divalproex (DEPAKOTE) 250 MG DR tablet Take 250 mg by mouth 2 (two) times daily.   Yes Historical Provider, MD  docusate sodium (COLACE) 100 MG capsule Take 100  mg by mouth daily.   Yes Historical Provider, MD  donepezil (ARICEPT) 5 MG tablet Take 5 mg by mouth at bedtime.   Yes Historical Provider, MD  esomeprazole (NEXIUM) 20 MG capsule Take 20 mg by mouth daily at 12 noon.   Yes Historical Provider, MD  hydrochlorothiazide (MICROZIDE) 12.5 MG capsule Take 12.5 mg by mouth daily.   Yes Historical Provider, MD  levETIRAcetam (KEPPRA) 500 MG tablet Take 500 mg by mouth 2 (two) times daily.   Yes Historical  Provider, MD  metFORMIN (GLUCOPHAGE) 500 MG tablet Take 500 mg by mouth daily.   Yes Historical Provider, MD  naproxen (NAPROSYN) 250 MG tablet Take 250 mg by mouth 2 (two) times daily as needed for mild pain, moderate pain or headache.   Yes Historical Provider, MD  nitroGLYCERIN (NITROSTAT) 0.4 MG SL tablet Place 0.4 mg under the tongue every 5 (five) minutes as needed for chest pain.   Yes Historical Provider, MD  polyethylene glycol (MIRALAX / GLYCOLAX) packet Take 17 g by mouth daily as needed for mild constipation, moderate constipation or severe constipation.   Yes Historical Provider, MD  potassium citrate (UROCIT-K) 10 MEQ (1080 MG) SR tablet Take 10 mEq by mouth daily.   Yes Historical Provider, MD  psyllium (METAMUCIL SMOOTH TEXTURE) 28 % packet Take 1 packet by mouth See admin instructions. One to two times a day as needed for constipation   Yes Historical Provider, MD  rosuvastatin (CRESTOR) 20 MG tablet Take 20 mg by mouth at bedtime.   Yes Historical Provider, MD  solifenacin (VESICARE) 10 MG tablet Take 10 mg by mouth daily.   Yes Historical Provider, MD  traZODone (DESYREL) 150 MG tablet Take 150 mg by mouth at bedtime.   Yes Historical Provider, MD      PHYSICAL EXAMINATION:   VITAL SIGNS: Blood pressure 132/80, pulse 78, temperature 99 F (37.2 C), temperature source Oral, resp. rate 28, height 5\' 8"  (1.727 m), weight 64.4 kg (141 lb 15.6 oz), SpO2 100 %.  GENERAL:  74 y.o.-year-old patient lying in the bed , moderate to severe respiratory distress, tachycardic and tachypneic, on BiPAP. Poorly awake and tries to open her eyes, however, unable to do so. Nonverbal. Mumbles by herself, intermittently follows commands.  EYES: Pupils equal, round, reactive to light and accommodation. No scleral icterus. Extraocular muscles intact.  HEENT: Head atraumatic, normocephalic. Oropharynx and nasopharynx clear.  NECK:  Supple, no jugular venous distention. No thyroid enlargement, no  tenderness.  LUNGS: Normal breath sounds bilaterally, no wheezing, few rales and rhonchi , as well as crepitations. Using accessory muscles of respiration, on BiPAP.  CARDIOVASCULAR: S1, S2 , tachycardic. No murmurs, rubs, or gallops.  ABDOMEN: Soft, nontender, nondistended. Bowel sounds present. No organomegaly or mass.  EXTREMITIES: No pedal edema, cyanosis, or clubbing.  NEUROLOGIC: Cranial nerves difficult to examine as patient is poorly alert, some right facial droop was noted. Muscle strength and able to examine, however, able to wiggle her fingers bilaterally. Sensation unable to examine. Gait not checked.  PSYCHIATRIC: The patient is obtunded, not able to open her eyes. Nonverbal. Mumbles by himself, intermittently follows commands  SKIN: No obvious rash, lesion, or ulcer.   LABORATORY PANEL:   CBC  Recent Labs Lab 10/22/15 1300  WBC 11.1*  HGB 13.5  HCT 39.3  PLT 195  MCV 91.4  MCH 31.3  MCHC 34.2  RDW 14.4  LYMPHSABS 0.9*  MONOABS 1.2*  EOSABS 0.0  BASOSABS 0.1   ------------------------------------------------------------------------------------------------------------------  Chemistries   Recent Labs Lab 10/22/15 1300  NA 140  K 3.9  CL 102  CO2 26  GLUCOSE 140*  BUN 37*  CREATININE 1.20*  CALCIUM 9.3  AST 28  ALT 14  ALKPHOS 58  BILITOT 1.2   ------------------------------------------------------------------------------------------------------------------  Cardiac Enzymes  Recent Labs Lab 10/22/15 1300  TROPONINI 0.29*   ------------------------------------------------------------------------------------------------------------------  RADIOLOGY: Ct Head Wo Contrast  10/22/2015  CLINICAL DATA:  Lethargy, unresponsive, UTI, cough, and congestion beginning 3 days ago EXAM: CT HEAD WITHOUT CONTRAST TECHNIQUE: Contiguous axial images were obtained from the base of the skull through the vertex without intravenous contrast. COMPARISON:  None  FINDINGS: Initial motion artifacts, for which repeat imaging was performed. Generalized atrophy with prominence of subarachnoid space. No midline shift or mass effect. Difficult to exclude BILATERAL subdural hygromas, with mild flattening of the hemispheres particularly in the frontal regions. Small vessel chronic ischemic changes of deep cerebral white matter with question small periventricular white matter infarcts. No intracranial hemorrhage, mass lesion or evidence of acute infarction. Atherosclerotic calcification of internal carotid and vertebral arteries at skullbase. Partial opacification of ethmoid air cells and sphenoid sinus as well as small air-fluid levels in the maxillary sinuses. No acute calvarial abnormalities. IMPRESSION: Atrophy with small vessel chronic ischemic changes of deep cerebral white matter. Suspected small periventricular white matter infarcts bilaterally. Question small chronic subdural hygromas bilaterally. No definite acute intracranial abnormalities. Electronically Signed   By: Lavonia Dana M.D.   On: 10/22/2015 15:50   Ct Chest W Contrast  10/22/2015  CLINICAL DATA:  Patient lethargic and unresponsive rate recent UTI, cough and congestion beginning 3 days ago. EXAM: CT CHEST WITH CONTRAST TECHNIQUE: Multidetector CT imaging of the chest was performed during intravenous contrast administration. CONTRAST:  75 mL Omnipaque 300 IV. COMPARISON:  Chest x-ray 10/22/2015 FINDINGS: The lungs are adequately inflated demonstrate bibasilar consolidation within the lower lobes right worse than left. There is subtle patchy nodular airspace disease over the right upper lobe and minimally over the lingula and left upper lobe as findings suggest multifocal pneumonia. No significant effusion. Airways are within normal. Median sternotomy wires are present. Heart is normal size. Moderate increased density over the left main, left anterior descending and right coronary arteries. 1.3 cm lymph node  over the precarinal region likely reactive. No significant hilar or axillary adenopathy. Minimal calcified plaque over the thoracic aorta. Remaining mediastinal structures are within normal. Images through the upper abdomen demonstrate calcified granuloma over the right lobe of the liver. 1 cm hypodensity over the inferior aspect of the right lobe of the liver near the gallbladder fossa likely a cyst. 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 1.4 cm exophytic hypodensity over the mid to upper pole of the left kidney likely a cyst. Possible small stone over the upper pole right kidney. Mild calcified plaque over the abdominal aorta. Mild degenerate change of the spine. IMPRESSION: Patchy bilateral airspace process likely multifocal pneumonia. 1.3 cm precarinal lymph node likely reactive. 1.4 cm left renal cyst. Possible small stone over the upper pole right kidney. Couple small sub cm liver hypodensities likely cysts. 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 Electronically Signed   By: Marin Olp M.D.   On: 10/22/2015 15:53   Dg Chest Portable 1 View  10/22/2015  CLINICAL DATA:  Altered mental status. Urinary tract infection. Initial encounter. EXAM: PORTABLE CHEST 1 VIEW COMPARISON:  None. FINDINGS: Midline sternotomy wires overlie normal cardiac silhouette. There is a RIGHT infrahilar density  in measuring 4 cm. Small RIGHT effusion. Upper lobes are clear. IMPRESSION: RIGHT infrahilar density with differential including of pneumonia versus mass. Consider CT thorax contrast for further evaluation. Electronically Signed   By: Suzy Bouchard M.D.   On: 10/22/2015 13:19    EKG: Orders placed or performed during the hospital encounter of 10/22/15  . ED EKG  . ED EKG    IMPRESSION AND PLAN:  Active Problems:   Pneumonia 1. Acute respiratory failure with hypoxia due to multifocal pneumonia, get ABGs and intubate Patient, get pulmonary involved, continue oxygen as needed to keep pulse oximeter at  around 94% 2. Multifocal pneumonia. Initiate patient Rocephin and Zithromax. Follow sputum cultures. Adjust antibiotics depending on culture results. Rule out aspiration , get Speech therapist involved whenever able 3. Pyuria, likely urinary tract infection. Get urine cultures. Continue antibiotic therapy 4. Acute encephalopathy, likely due to metabolic arrangements including hypoxia, infection, neuro checks, follow clinically 5. Renal insufficiency. Continue IV fluids. Follow kidney function in the morning 6. Diabetes mellitus. Continue metformin and the sliding scale insulin 7. Leukocytosis. Follow with antibiotic therapy 8. Elevated troponin, likely due to hypoxia demand ischemia. Follow troponins 3, initiate patient on aspirin therapy rectally. Nitroglycerin topically and metoprolol intravenously, getting the echocardiogram and cardiologist involved   All the records are reviewed and case discussed with ED provider. Management plans discussed with the patient, family and they are in agreement.  CODE STATUS:  Full code as discussed with patient's granddaughter  TOTAL CRITICAL CARE TIME TAKING CARE OF THIS PATIENT: 60 minutes.    Theodoro Grist M.D on 10/22/2015 at 4:34 PM  Between 7am to 6pm - Pager - [phone] After 6pm go to [email or web address] - password [email or web address] [facility]  [facility]  Office  [phone]  CC: Primary care physician; No primary care provider on file.

## 2015-10-22 NOTE — ED Notes (Signed)
Dr. Ether Griffins at bedside to assess patient.  Patient continues to open eyes to verbal stimuli and verbal response is present but incomprehensible.  No Gag reflex.  Thick green sputum noted to be at back of throat.  Dr. Cinda Quest and Dr. Ether Griffins have decided to intubate patient to protect airway.

## 2015-10-22 NOTE — Progress Notes (Signed)
eLink Physician-Brief Progress Note Patient Name: Jaquesha Boroff DOB: January 20, 1941 MRN: [id number]   Date of Service  10/22/2015  HPI/Events of Note  88 F with MMP including DM, dementia, h/o of seizure, s/kp CVA with left-sided weakness and dysphagia admitted with AMS, dry cough, increasing weakness and right facial droop.  Found to be febrile with pyruia and CT findings of multifocal PNA. Lactate WNL.   Initially tried on BiPAP but failed due to continued tachypnea.  Intubated.  Patient admitted to Internal Medicine service.  Currently HD stable with BP of 95/67 (76), HR of 72 and sats of 97% on vent.  eICU Interventions  Plan of care per primary team 1. protonix IV for stress ulcer propy 2. SSI q4 hours moderate scale 3. PRN fentanyl for pain in addition to propofol 4. Consider holding metformin with ARI and while vented and continue SSI     Intervention Category Evaluation Type: New Patient Evaluation  Ravindra Baranek 10/22/2015, 9:57 PM

## 2015-10-22 NOTE — ED Notes (Signed)
To CT Scan with RN and RCP.

## 2015-10-22 NOTE — ED Notes (Signed)
Return from CT Scan.  Patient tolerated well.  Continue to monitor.

## 2015-10-22 NOTE — Progress Notes (Signed)
Attempted arterial puncture using right radial artery without success.  Additionally, another RT attempted brachial artery without success resulting in ED physician successfully obtaining a sample from the femoral artery.  Results are in there system at this time.

## 2015-10-22 NOTE — ED Notes (Signed)
Family that lives with patient called EMS this morning due to patient being lethargic and unresponsive.  They also report a recent UTI and cough and congestion that started approximately 3 days ago.  Also, pt has had decreased oral intake over the last 24hrs and increased incontinence.

## 2015-10-23 ENCOUNTER — Inpatient Hospital Stay: Payer: Medicare Other

## 2015-10-23 ENCOUNTER — Inpatient Hospital Stay
Admit: 2015-10-23 | Discharge: 2015-10-23 | Disposition: A | Discharge status: Home / Self Care | Payer: Medicare Other | Attending: Internal Medicine | Admitting: Internal Medicine

## 2015-10-23 DIAGNOSIS — G9340 Encephalopathy, unspecified: Secondary | ICD-10-CM

## 2015-10-23 DIAGNOSIS — R7989 Other specified abnormal findings of blood chemistry: Secondary | ICD-10-CM

## 2015-10-23 DIAGNOSIS — J9600 Acute respiratory failure, unspecified whether with hypoxia or hypercapnia: Secondary | ICD-10-CM

## 2015-10-23 DIAGNOSIS — J189 Pneumonia, unspecified organism: Secondary | ICD-10-CM

## 2015-10-23 LAB — GLUCOSE, CAPILLARY
GLUCOSE-CAPILLARY: 104 mg/dL — AB (ref 65–99)
GLUCOSE-CAPILLARY: 115 mg/dL — AB (ref 65–99)
GLUCOSE-CAPILLARY: 137 mg/dL — AB (ref 65–99)
GLUCOSE-CAPILLARY: 167 mg/dL — AB (ref 65–99)
GLUCOSE-CAPILLARY: 175 mg/dL — AB (ref 65–99)
Glucose-Capillary: 98 mg/dL (ref 65–99)

## 2015-10-23 LAB — BASIC METABOLIC PANEL
ANION GAP: 13 (ref 5–15)
BUN: 33 mg/dL — ABNORMAL HIGH (ref 6–20)
CO2: 26 mmol/L (ref 22–32)
Calcium: 9 mg/dL (ref 8.9–10.3)
Chloride: 103 mmol/L (ref 101–111)
Creatinine, Ser: 0.92 mg/dL (ref 0.44–1.00)
GFR calc Af Amer: >60 mL/min (ref >60)
Glucose, Bld: 96 mg/dL (ref 65–99)
POTASSIUM: 3 mmol/L — AB (ref 3.5–5.1)
SODIUM: 142 mmol/L (ref 135–145)

## 2015-10-23 LAB — CBC
HEMATOCRIT: 37.6 % (ref 35.0–47.0)
HEMOGLOBIN: 12.5 g/dL (ref 12.0–16.0)
MCH: 30.8 pg (ref 26.0–34.0)
MCHC: 33.2 g/dL (ref 32.0–36.0)
MCV: 92.8 fL (ref 80.0–100.0)
Platelets: 188 10*3/uL (ref 150–440)
RBC: 4.05 MIL/uL (ref 3.80–5.20)
RDW: 14.4 % (ref 11.5–14.5)
WBC: 12.7 10*3/uL — AB (ref 3.6–11.0)

## 2015-10-23 LAB — TROPONIN I
Troponin I: 0.05 ng/mL — ABNORMAL HIGH (ref <0.031)
Troponin I: 0.07 ng/mL — ABNORMAL HIGH (ref <0.031)
Troponin I: 0.08 ng/mL — ABNORMAL HIGH (ref <0.031)

## 2015-10-23 LAB — PROTIME-INR
INR: 1.25
Prothrombin Time: 15.9 seconds — ABNORMAL HIGH (ref 11.4–15.0)

## 2015-10-23 LAB — TRIGLYCERIDES: Triglycerides: 97 mg/dL (ref <150)

## 2015-10-23 LAB — HEMOGLOBIN A1C: HEMOGLOBIN A1C: 5.7 % (ref 4.0–6.0)

## 2015-10-23 LAB — MAGNESIUM: MAGNESIUM: 1.9 mg/dL (ref 1.7–2.4)

## 2015-10-23 LAB — TSH: TSH: 1.943 u[IU]/mL (ref 0.350–4.500)

## 2015-10-23 MED ORDER — VALPROATE SODIUM 250 MG/5ML PO SYRP
250.0000 mg | ORAL_SOLUTION | Freq: Two times a day (BID) | ORAL | Status: DC
  Administered 2015-10-23 – 2015-10-24 (×3): 250 mg
  Filled 2015-10-23 (×4): qty 5

## 2015-10-23 MED ORDER — POTASSIUM CHLORIDE 20 MEQ/15ML (10%) PO SOLN
40.0000 meq | Freq: Two times a day (BID) | ORAL | Status: AC
  Administered 2015-10-23 (×2): 40 meq
  Filled 2015-10-23 (×2): qty 30

## 2015-10-23 MED ORDER — FENTANYL CITRATE (PF) 100 MCG/2ML IJ SOLN: 12.5000 ug | INTRAMUSCULAR | Status: DC | PRN

## 2015-10-23 MED ORDER — FAMOTIDINE 20 MG PO TABS
20.0000 mg | ORAL_TABLET | Freq: Two times a day (BID) | ORAL | Status: DC
  Administered 2015-10-23 – 2015-10-24 (×3): 20 mg
  Filled 2015-10-23 (×3): qty 1

## 2015-10-23 MED ORDER — VITAL HIGH PROTEIN PO LIQD: 1000.0000 mL | ORAL | Status: DC

## 2015-10-23 MED ORDER — FREE WATER
200.0000 mL | Freq: Three times a day (TID) | Status: DC
  Administered 2015-10-23 – 2015-10-24 (×3): 200 mL

## 2015-10-23 MED ORDER — POTASSIUM CHLORIDE IN NACL 20-0.9 MEQ/L-% IV SOLN
INTRAVENOUS | Status: DC
  Filled 2015-10-23 (×2): qty 1000

## 2015-10-23 MED ORDER — CARVEDILOL 6.25 MG PO TABS
6.2500 mg | ORAL_TABLET | Freq: Two times a day (BID) | ORAL | Status: DC
Start: 2015-10-23 — End: 2015-10-24
  Administered 2015-10-23 – 2015-10-24 (×2): 6.25 mg via ORAL
  Filled 2015-10-23 (×2): qty 1

## 2015-10-23 MED ORDER — VITAL HIGH PROTEIN PO LIQD
1000.0000 mL | ORAL | Status: DC
  Administered 2015-10-23: 1000 mL

## 2015-10-23 MED ORDER — CLOPIDOGREL BISULFATE 75 MG PO TABS
75.0000 mg | ORAL_TABLET | Freq: Every day | ORAL | Status: DC
  Administered 2015-10-24: 75 mg
  Filled 2015-10-23: qty 1

## 2015-10-23 MED ORDER — CHLORHEXIDINE GLUCONATE 0.12% ORAL RINSE (MEDLINE KIT)
15.0000 mL | Freq: Two times a day (BID) | OROMUCOSAL | Status: DC
  Administered 2015-10-23 – 2015-10-24 (×3): 15 mL via OROMUCOSAL
  Filled 2015-10-23 (×5): qty 15

## 2015-10-23 MED ORDER — ROSUVASTATIN CALCIUM 10 MG PO TABS
20.0000 mg | ORAL_TABLET | Freq: Every day | ORAL | Status: DC
  Administered 2015-10-23: 20 mg
  Filled 2015-10-23: qty 2

## 2015-10-23 MED ORDER — NALOXONE HCL 0.4 MG/ML IJ SOLN: 0.4000 mg | Freq: Once | INTRAMUSCULAR | Status: DC

## 2015-10-23 MED ORDER — ANTISEPTIC ORAL RINSE SOLUTION (CORINZ)
7.0000 mL | Freq: Four times a day (QID) | OROMUCOSAL | Status: DC
  Administered 2015-10-24 – 2015-10-27 (×12): 7 mL via OROMUCOSAL
  Filled 2015-10-23 (×18): qty 7

## 2015-10-23 MED ORDER — DONEPEZIL HCL 5 MG PO TABS
5.0000 mg | ORAL_TABLET | Freq: Every day | ORAL | Status: DC
  Administered 2015-10-23: 5 mg
  Filled 2015-10-23: qty 1

## 2015-10-23 MED ORDER — ASPIRIN 81 MG PO CHEW
81.0000 mg | CHEWABLE_TABLET | Freq: Every day | ORAL | Status: DC
  Administered 2015-10-23 – 2015-10-24 (×2): 81 mg
  Filled 2015-10-23 (×2): qty 1

## 2015-10-23 NOTE — Progress Notes (Signed)
[facility] [place] NAME: Debra Hoffman    MR#:  [id number]  DATE OF BIRTH:  1941/02/26  SUBJECTIVE:  CHIEF COMPLAINT:   Chief Complaint  Patient presents with  . Altered Mental Status   patient is 74 year old African-American female who presents to the hospital with altered mental status, increasing weakness and  Right facial droop. In ER, she was noted to be hypotensive, was initiated on BiPAP, however, was intubated. Later on because of hypoxemia  and inability to protect her airways. She was given IV fluids, antibiotics.  Her blood pressure has improved as well as her oxygenation. She is off sedation. However, but still not able to respond, except of moving her left arm., unable to get review of systems.   Review of Systems  Unable to perform ROS: mental acuity    VITAL SIGNS: Blood pressure 133/78, pulse 70, temperature 99.7 F (37.6 C), temperature source Oral, resp. rate 12, height 5\' 4"  (1.626 m), weight 67 kg (147 lb 11.3 oz), SpO2 95 %.  PHYSICAL EXAMINATION:   GENERAL:  74 y.o.-year-old patient lying in the bed with no acute distress. Intubated, sedated. Pupils are 1 mm, reactive to light EYES: Pupils equal, round, reactive to light and accommodation. No scleral icterus. Extraocular muscles intact.  HEENT: Head atraumatic, normocephalic. Oropharynx and nasopharynx clear.  NECK:  Supple, no jugular venous distention. No thyroid enlargement, no tenderness.  LUNGS: Normal breath sounds bilaterally, no wheezing, rales,rhonchi or crepitation. No use of accessory muscles of respiration. ET tube is in place CARDIOVASCULAR: S1, S2 normal. No murmurs, rubs, or gallops.  ABDOMEN: Soft, nontender, nondistended. Bowel sounds present. No organomegaly or mass.  EXTREMITIES: No pedal edema, cyanosis, or clubbing.  NEUROLOGIC: Cranial nerves II through XII are intact. Muscle strength 5/5 in all extremities. Sensation intact. Gait not  checked.  PSYCHIATRIC: The patient is sedated .  SKIN: No obvious rash, lesion, or ulcer.   ORDERS/RESULTS REVIEWED:   CBC  Recent Labs Lab 10/22/15 1300 10/23/15 0447  WBC 11.1* 12.7*  HGB 13.5 12.5  HCT 39.3 37.6  PLT 195 188  MCV 91.4 92.8  MCH 31.3 30.8  MCHC 34.2 33.2  RDW 14.4 14.4  LYMPHSABS 0.9*  --   MONOABS 1.2*  --   EOSABS 0.0  --   BASOSABS 0.1  --    ------------------------------------------------------------------------------------------------------------------  Chemistries   Recent Labs Lab 10/22/15 1300 10/23/15 0447  NA 140 142  K 3.9 3.0*  CL 102 103  CO2 26 26  GLUCOSE 140* 96  BUN 37* 33*  CREATININE 1.20* 0.92  CALCIUM 9.3 9.0  AST 28  --   ALT 14  --   ALKPHOS 58  --   BILITOT 1.2  --    ------------------------------------------------------------------------------------------------------------------ estimated creatinine clearance is 52 mL/min (by C-G formula based on Cr of 0.92). ------------------------------------------------------------------------------------------------------------------  Recent Labs  10/23/15 0151  TSH 1.943    Cardiac Enzymes  Recent Labs Lab 10/22/15 2000 10/23/15 0151 10/23/15 0447  TROPONINI 0.04* 0.05* 0.07*   ------------------------------------------------------------------------------------------------------------------ Invalid input(s): POCBNP ---------------------------------------------------------------------------------------------------------------  RADIOLOGY: Ct Head Wo Contrast  10/22/2015  CLINICAL DATA:  Lethargy, unresponsive, UTI, cough, and congestion beginning 3 days ago EXAM: CT HEAD WITHOUT CONTRAST TECHNIQUE: Contiguous axial images were obtained from the base of the skull through the vertex without intravenous contrast. COMPARISON:  None FINDINGS: Initial motion artifacts, for which repeat imaging was performed. Generalized atrophy with prominence of subarachnoid space. No  midline shift or  mass effect. Difficult to exclude BILATERAL subdural hygromas, with mild flattening of the hemispheres particularly in the frontal regions. Small vessel chronic ischemic changes of deep cerebral white matter with question small periventricular white matter infarcts. No intracranial hemorrhage, mass lesion or evidence of acute infarction. Atherosclerotic calcification of internal carotid and vertebral arteries at skullbase. Partial opacification of ethmoid air cells and sphenoid sinus as well as small air-fluid levels in the maxillary sinuses. No acute calvarial abnormalities. IMPRESSION: Atrophy with small vessel chronic ischemic changes of deep cerebral white matter. Suspected small periventricular white matter infarcts bilaterally. Question small chronic subdural hygromas bilaterally. No definite acute intracranial abnormalities. Electronically Signed   By: Lavonia Dana M.D.   On: 10/22/2015 15:50   Ct Chest W Contrast  10/22/2015  CLINICAL DATA:  Patient lethargic and unresponsive rate recent UTI, cough and congestion beginning 3 days ago. EXAM: CT CHEST WITH CONTRAST TECHNIQUE: Multidetector CT imaging of the chest was performed during intravenous contrast administration. CONTRAST:  75 mL Omnipaque 300 IV. COMPARISON:  Chest x-ray 10/22/2015 FINDINGS: The lungs are adequately inflated demonstrate bibasilar consolidation within the lower lobes right worse than left. There is subtle patchy nodular airspace disease over the right upper lobe and minimally over the lingula and left upper lobe as findings suggest multifocal pneumonia. No significant effusion. Airways are within normal. Median sternotomy wires are present. Heart is normal size. Moderate increased density over the left main, left anterior descending and right coronary arteries. 1.3 cm lymph node over the precarinal region likely reactive. No significant hilar or axillary adenopathy. Minimal calcified plaque over the thoracic aorta.  Remaining mediastinal structures are within normal. Images through the upper abdomen demonstrate calcified granuloma over the right lobe of the liver. 1 cm hypodensity over the inferior aspect of the right lobe of the liver near the gallbladder fossa likely a cyst. 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 1.4 cm exophytic hypodensity over the mid to upper pole of the left kidney likely a cyst. Possible small stone over the upper pole right kidney. Mild calcified plaque over the abdominal aorta. Mild degenerate change of the spine. IMPRESSION: Patchy bilateral airspace process likely multifocal pneumonia. 1.3 cm precarinal lymph node likely reactive. 1.4 cm left renal cyst. Possible small stone over the upper pole right kidney. Couple small sub cm liver hypodensities likely cysts. 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 Electronically Signed   By: Marin Olp M.D.   On: 10/22/2015 15:53   Dg Chest Portable 1 View  10/22/2015  CLINICAL DATA:  Post intubation EXAM: PORTABLE CHEST 1 VIEW COMPARISON:  CT chest dated 10/22/2015 FINDINGS: Endotracheal tube terminates 6 cm above the carina. Patchy bilateral lower lobe opacities, atelectasis versus pneumonia. Mild elevation of the right hemidiaphragm. No pneumothorax. The heart is normal in size.  Median sternotomy. Enteric tube courses below the diaphragm. IMPRESSION: Endotracheal tube terminates 6 cm above the carina. Mild patchy bilateral lower lobe opacities, atelectasis versus pneumonia. Electronically Signed   By: Julian Hy M.D.   On: 10/22/2015 19:53   Dg Chest Portable 1 View  10/22/2015  CLINICAL DATA:  Altered mental status. Urinary tract infection. Initial encounter. EXAM: PORTABLE CHEST 1 VIEW COMPARISON:  None. FINDINGS: Midline sternotomy wires overlie normal cardiac silhouette. There is a RIGHT infrahilar density in measuring 4 cm. Small RIGHT effusion. Upper lobes are clear. IMPRESSION: RIGHT infrahilar density with differential  including of pneumonia versus mass. Consider CT thorax contrast for further evaluation. Electronically Signed  By: Suzy Bouchard M.D.   On: 10/22/2015 13:19    EKG:  Orders placed or performed during the hospital encounter of 10/22/15  . ED EKG  . ED EKG  . EKG 12-Lead  . EKG 12-Lead    ASSESSMENT AND PLAN:  Active Problems:   Pneumonia 1. Acute respiratory failure with hypoxia due to multifocal pneumonia, now intubated , , appreciate pulmonary input, continue oxygen as needed to keep pulse oximeter at around 94%. Patient is off sedation, per nursing staff, and is still not following commands, following clinically. Get palliative care involvement on Monday to discuss case with family and follow along 2. Multifocal pneumonia. Continue patient Rocephin and Zithromax. Follow sputum cultures. Adjust antibiotics depending on culture results. Rule out aspiration , get Speech therapist involved whenever able 3. Pyuria, unlikely urinary tract infection. Negative urine cultures.  4. Acute encephalopathy, suspected due to metabolic arrangements including hypoxia, infection, however, patient does not respond now since sedation is turned off, continue neuro checks, follow clinically, get palliative care involved, may repeat CT scan of head without contrast tomorrow morning 5. Renal insufficiency. Resolved on IV fluids. Follow kidney function closely 6. Diabetes mellitus. Continue metformin and the sliding scale insulin, blood glucose levels are between 100s to 120s 7. Leukocytosis. Stable with antibiotic therapy 8. Elevated troponin, likely due to hypoxia demand ischemia. Stable troponins 3, continue patient on aspirin therapy rectally. Nitroglycerin topically and metoprolol intravenously, getting the echocardiogram and cardiologist involved. Further evaluation and intervention depending on her neurologic status   Management plans discussed with the patient, family and they are in  agreement.   DRUG ALLERGIES:  Allergies  Allergen Reactions  . Shellfish Allergy     Reaction: unknown    CODE STATUS:     Code Status Orders        Start     Ordered   10/22/15 2157  Full code   Continuous     10/22/15 2156      TOTAL TIME TAKING CARE OF THIS PATIENT: 45 minutes.    Theodoro Grist M.D on 10/23/2015 at 2:46 PM  Between 7am to 6pm - Pager - [phone]  After 6pm go to [email or web address] - password [email or web address] [facility]  [facility]  Office  [phone]  CC: Primary care physician; No primary care provider on file.

## 2015-10-23 NOTE — Consult Note (Signed)
PULMONARY / CRITICAL CARE MEDICINE   Name: Debra Hoffman MRN: [id number] DOB: [birth year]    ADMISSION DATE:  10/22/2015 CONSULTATION DATE:  10/23/15  PT PROFILE: 44 F brought to ED for AMS and intubated for same. Small elevation in trop I noted  MAJOR EVENTS/TEST RESULTS: 10/28 CT head: NAD 10/28 CT chest: Patchy bilateral airspace process likely multifocal pneumonia. 1.3 cm precarinal lymph node likely reactive. 10/29 TTE:   INDWELLING DEVICES:: ETT 10/28 >>   MICRO DATA: Results for orders placed or performed during the hospital encounter of 10/22/15  Urine culture     Status: None (Preliminary result)   Collection Time: 10/22/15  2:55 PM  Result Value Ref Range Status   Specimen Description URINE, RANDOM  Final   Special Requests Normal  Final   Culture NO GROWTH < 24 HOURS  Final   Report Status PENDING  Incomplete  MRSA PCR Screening     Status: None   Collection Time: 10/22/15  9:47 PM  Result Value Ref Range Status   MRSA by PCR NEGATIVE NEGATIVE Final    Comment:        The GeneXpert MRSA Assay (FDA approved for NASAL specimens only), is one component of a comprehensive MRSA colonization surveillance program. It is not intended to diagnose MRSA infection nor to guide or monitor treatment for MRSA infections.      ANTIMICROBIALS:  Anti-infectives    Start     Dose/Rate Route Frequency Ordered Stop   10/22/15 1615  cefTRIAXone (ROCEPHIN) 1 g in dextrose 5 % 50 mL IVPB     1 g 100 mL/hr over 30 Minutes Intravenous Every 24 hours 10/22/15 1612     10/22/15 1615  azithromycin (ZITHROMAX) 500 mg in dextrose 5 % 250 mL IVPB     500 mg 250 mL/hr over 60 Minutes Intravenous Every 24 hours 10/22/15 1612     10/22/15 1345  levofloxacin (LEVAQUIN) IVPB 750 mg     750 mg 100 mL/hr over 90 Minutes Intravenous  Once 10/22/15 1331 10/22/15 1701        HISTORY OF PRESENT ILLNESS:  Level 5 caveat. No family available. Admission note reviewed  PAST MEDICAL  HISTORY :   has a past medical history of Dysuria; Seizures ([place]); Insomnia; Wheelchair dependence; Pressure ulcer of foot, stage 2; Urinary incontinence; Coronary artery disease; Stroke ([place]); Hypertension; and Glaucoma.  has past surgical history that includes Cardiac surgery and Coronary artery bypass graft. Prior to Admission medications   Medication Sig Start Date End Date Taking? Authorizing Provider  brinzolamide (AZOPT) 1 % ophthalmic suspension Place 1 drop into both eyes 3 (three) times daily.   Yes Historical Provider, MD  carvedilol (COREG) 12.5 MG tablet Take 12.5 mg by mouth 2 (two) times daily.   Yes Historical Provider, MD  clopidogrel (PLAVIX) 75 MG tablet Take 75 mg by mouth daily.   Yes Historical Provider, MD  clotrimazole (LOTRIMIN) 1 % cream Apply 1 application topically 2 (two) times daily. For 7-14 days 10/19/15  Yes Historical Provider, MD  divalproex (DEPAKOTE) 250 MG DR tablet Take 250 mg by mouth 2 (two) times daily.   Yes Historical Provider, MD  docusate sodium (COLACE) 100 MG capsule Take 100 mg by mouth daily.   Yes Historical Provider, MD  donepezil (ARICEPT) 5 MG tablet Take 5 mg by mouth at bedtime.   Yes Historical Provider, MD  esomeprazole (NEXIUM) 20 MG capsule Take 20 mg by mouth daily at 12 noon.   Yes  Historical Provider, MD  hydrochlorothiazide (MICROZIDE) 12.5 MG capsule Take 12.5 mg by mouth daily.   Yes Historical Provider, MD  levETIRAcetam (KEPPRA) 500 MG tablet Take 500 mg by mouth 2 (two) times daily.   Yes Historical Provider, MD  metFORMIN (GLUCOPHAGE) 500 MG tablet Take 500 mg by mouth daily.   Yes Historical Provider, MD  naproxen (NAPROSYN) 250 MG tablet Take 250 mg by mouth 2 (two) times daily as needed for mild pain, moderate pain or headache.   Yes Historical Provider, MD  nitroGLYCERIN (NITROSTAT) 0.4 MG SL tablet Place 0.4 mg under the tongue every 5 (five) minutes as needed for chest pain.   Yes Historical Provider, MD  polyethylene  glycol (MIRALAX / GLYCOLAX) packet Take 17 g by mouth daily as needed for mild constipation, moderate constipation or severe constipation.   Yes Historical Provider, MD  potassium citrate (UROCIT-K) 10 MEQ (1080 MG) SR tablet Take 10 mEq by mouth daily.   Yes Historical Provider, MD  psyllium (METAMUCIL SMOOTH TEXTURE) 28 % packet Take 1 packet by mouth See admin instructions. One to two times a day as needed for constipation   Yes Historical Provider, MD  rosuvastatin (CRESTOR) 20 MG tablet Take 20 mg by mouth at bedtime.   Yes Historical Provider, MD  solifenacin (VESICARE) 10 MG tablet Take 10 mg by mouth daily.   Yes Historical Provider, MD  traZODone (DESYREL) 150 MG tablet Take 150 mg by mouth at bedtime.   Yes Historical Provider, MD   Allergies  Allergen Reactions  . Shellfish Allergy     Reaction: unknown    FAMILY HISTORY:  has no family status information on file.  SOCIAL HISTORY:    REVIEW OF SYSTEMS:  Unable  SUBJECTIVE:   VITAL SIGNS: Temp:  [97.1 F (36.2 C)-99.7 F (37.6 C)] 99.7 F (37.6 C) (10/29 1400) Pulse Rate:  [68-86] 78 (10/29 1400) Resp:  [0-32] 17 (10/29 1400) BP: (63-174)/(47-96) 148/80 mmHg (10/29 1400) SpO2:  [91 %-100 %] 99 % (10/29 1400) FiO2 (%):  [30 %-100 %] 30 % (10/29 1121) Weight:  [67 kg (147 lb 11.3 oz)-67.2 kg (148 lb 2.4 oz)] 67 kg (147 lb 11.3 oz) (10/28 2200) HEMODYNAMICS:   VENTILATOR SETTINGS: Vent Mode:  [-] PRVC FiO2 (%):  [30 %-100 %] 30 % Set Rate:  [12 bmp-18 bmp] 12 bmp Vt Set:  [500 mL] 500 mL PEEP:  [5 cmH20] 5 cmH20 Plateau Pressure:  [17 cmH20] 17 cmH20 INTAKE / OUTPUT:  Intake/Output Summary (Last 24 hours) at 10/23/15 1552 Last data filed at 10/23/15 1447  Gross per 24 hour  Intake 490.06 ml  Output    100 ml  Net 390.06 ml    PHYSICAL EXAMINATION: General: RASS -4, not F/C Neuro: pinpoint pupils, minimal withdrawal from pain HEENT: NCAT, WNL Cardiovascular: regular, no M noted Lungs: clear  anteriorly Abdomen: Soft, NT, +BS Ext: warm, no edema  LABS:  CBC  Recent Labs Lab 10/22/15 1300 10/23/15 0447  WBC 11.1* 12.7*  HGB 13.5 12.5  HCT 39.3 37.6  PLT 195 188   Coag's  Recent Labs Lab 10/23/15 0151  INR 1.25   BMET  Recent Labs Lab 10/22/15 1300 10/23/15 0447  NA 140 142  K 3.9 3.0*  CL 102 103  CO2 26 26  BUN 37* 33*  CREATININE 1.20* 0.92  GLUCOSE 140* 96   Electrolytes  Recent Labs Lab 10/22/15 1300 10/23/15 0447 10/23/15 1442  CALCIUM 9.3 9.0  --   MG  --   --  1.9   Sepsis Markers  Recent Labs Lab 10/22/15 1300 10/22/15 1519  LATICACIDVEN 1.7 2.0   ABG  Recent Labs Lab 10/22/15 1740 10/22/15 1958  PHART 7.52* 7.60*  PCO2ART 37 29*  PO2ART 106 133*   Liver Enzymes  Recent Labs Lab 10/22/15 1300  AST 28  ALT 14  ALKPHOS 58  BILITOT 1.2  ALBUMIN 2.8*   Cardiac Enzymes  Recent Labs Lab 10/23/15 0151 10/23/15 0447 10/23/15 1442  TROPONINI 0.05* 0.07* 0.08*   Glucose  Recent Labs Lab 10/22/15 2318 10/23/15 0326 10/23/15 0721 10/23/15 1146  GLUCAP 125* 104* 115* 98    Imaging Dg Chest Portable 1 View  10/22/2015  CLINICAL DATA:  Post intubation EXAM: PORTABLE CHEST 1 VIEW COMPARISON:  CT chest dated 10/22/2015 FINDINGS: Endotracheal tube terminates 6 cm above the carina. Patchy bilateral lower lobe opacities, atelectasis versus pneumonia. Mild elevation of the right hemidiaphragm. No pneumothorax. The heart is normal in size.  Median sternotomy. Enteric tube courses below the diaphragm. IMPRESSION: Endotracheal tube terminates 6 cm above the carina. Mild patchy bilateral lower lobe opacities, atelectasis versus pneumonia. Electronically Signed   By: Julian Hy M.D.   On: 10/22/2015 19:53     ASSESSMENT / PLAN:  PULMONARY A: Acute resp failure due to AMS Pulmonary infiltrates - presumed to be CAP P:   Cont full vent support - settings reviewed and/or adjusted Cont vent bundle Daily SBT  if/when meets criteria   CARDIOVASCULAR A: No issues P:  monitor  RENAL A:  Hypokalemia P: Monitor BMET intermittently Monitor I/Os Correct electrolytes as indicated  HEMATOLOGIC A:  No issues P:  DVT px: SQ heparin Monitor CBC intermittently Transfuse per usual ICU guidelines  INFECTIOUS A:   Presumed CAP P:   Monitor temp, WBC count Micro and abx as above Check PCT AM 10/30  ENDOCRINE A:   No issuers P:   Monitor glu intermittently Consider SSI if glu > 180  NEUROLOGIC A:  H/O CVA Acute hypoactive delirium of unclear etiology P:   RASS goal: 0, -1 Minimize sedating medications Daily WUA  CCM time: 40 mins The above time includes time spent in consultation with patient and/or family members and reviewing care plan on multidisciplinary rounds  Merton Border, MD PCCM service Mobile [phone] Pager [phone]     10/23/2015, 3:52 PM

## 2015-10-23 NOTE — Progress Notes (Signed)
Initial Nutrition Assessment    INTERVENTION:   EN: Adult Tube Feeding Protocol entered by MD Simonds with Vital High Protein at rate of 40 ml/hr (being at 20 ml/hr). Recommend changing goal rate of 55 ml/hr providing 116 g of protein, 1320 kcals, 1109 mL of free water. Free water flushes of 200 mL q 8 hours already ordered per MD. Continue to assess   NUTRITION DIAGNOSIS:   Inadequate oral intake related to acute illness as evidenced by NPO status.  GOAL:   Provide needs based on ASPEN/SCCM guidelines   MONITOR:    (Energy Intake, Anthropometrics, Electrolyte/Renal Profile, Glucose Profile, Digestive System, Pulmonary)  REASON FOR ASSESSMENT:   Consult, Ventilator Enteral/tube feeding initiation and management  ASSESSMENT:    Pt admitted with AMS, acute respiratory failure with pneumonia requiring intubation  Past Medical History  Diagnosis Date  . Dysuria   . Seizures ([place])   . Insomnia   . Wheelchair dependence   . Pressure ulcer of foot, stage 2   . Urinary incontinence   . Coronary artery disease   . Stroke ([place])   . Hypertension   . Glaucoma     EN: Vital High Protein at 40 ml/hr (being at 20 ml/hr) ordered by MD this AM per Adult Tube Feeding protocol  Food and Nutrition Related History: noted poor appetite with no intake for 1 week prior to admission per MD notes  Digestive System: OG in place, abdomen soft, BS hypoactive  Last BM:   unknown at present  Electrolyte and Renal Profile:  Recent Labs Lab 10/22/15 1300 10/23/15 0447  BUN 37* 33*  CREATININE 1.20* 0.92  NA 140 142  K 3.9 3.0*   Glucose Profile:  Recent Labs  10/22/15 2318 10/23/15 0326 10/23/15 0721  GLUCAP 125* 104* 115*   Nutritional Anemia Profile:  CBC Latest Ref Rng 10/23/2015 10/22/2015  WBC 3.6 - 11.0 K/uL 12.7(H) 11.1(H)  Hemoglobin 12.0 - 16.0 g/dL 12.5 13.5  Hematocrit 35.0 - 47.0 % 37.6 39.3  Platelets 150 - 440 K/uL 188 195    Protein Profile:  Recent  Labs Lab 10/22/15 1300  ALBUMIN 2.8*   Meds: ss novolog, versed, fentanyl  Nutritio Focused Physical Exam:  Unable to complete Nutrition-Focused physical exam at this time.   Height:   Ht Readings from Last 1 Encounters:  10/22/15 5\' 4"  (1.626 m)    Weight:   Wt Readings from Last 1 Encounters:  10/22/15 147 lb 11.3 oz (67 kg)   Filed Weights   10/22/15 1218 10/22/15 1857 10/22/15 2200  Weight: 141 lb 15.6 oz (64.4 kg) 148 lb 2.4 oz (67.2 kg) 147 lb 11.3 oz (67 kg)    BMI:  Body mass index is 25.34 kg/(m^2).  Estimated Nutritional Needs:   Kcal:  1374 kcals (BEE 1165, Ve: 6.1, Tmax: 37.6) using wt of 67 kg  Protein:  101-134 g (1.5-2.0 g/kg)   Fluid:  1675-2010 mL (25-30 ml/kg)   HIGH Care Level  Kerman Passey MS, RD, LDN [phone] Pager

## 2015-10-24 ENCOUNTER — Encounter: Payer: Self-pay | Admitting: *Deleted

## 2015-10-24 ENCOUNTER — Inpatient Hospital Stay: Payer: Medicare Other

## 2015-10-24 DIAGNOSIS — J9601 Acute respiratory failure with hypoxia: Secondary | ICD-10-CM

## 2015-10-24 DIAGNOSIS — R7989 Other specified abnormal findings of blood chemistry: Secondary | ICD-10-CM

## 2015-10-24 DIAGNOSIS — R778 Other specified abnormalities of plasma proteins: Secondary | ICD-10-CM

## 2015-10-24 LAB — BLOOD GAS, ARTERIAL
ALLENS TEST (PASS/FAIL): POSITIVE — AB
Acid-Base Excess: 7.4 mmol/L — ABNORMAL HIGH (ref 0.0–3.0)
Bicarbonate: 28.5 mEq/L — ABNORMAL HIGH (ref 21.0–28.0)
FIO2: 1
LHR: 18 {breaths}/min
MECHANICAL RATE: 18
MECHVT: 500 mL
O2 SAT: 99.4 %
PATIENT TEMPERATURE: 37
PCO2 ART: 29 mmHg — AB (ref 32.0–48.0)
PEEP: 5 cmH2O
PO2 ART: 133 mmHg — AB (ref 83.0–108.0)
pH, Arterial: 7.6 — ABNORMAL HIGH (ref 7.350–7.450)

## 2015-10-24 LAB — COMPREHENSIVE METABOLIC PANEL
ALBUMIN: 2 g/dL — AB (ref 3.5–5.0)
ALK PHOS: 54 U/L (ref 38–126)
ALT: 57 U/L — AB (ref 14–54)
ALT: 48 U/L (ref 14–54)
AST: 93 U/L — ABNORMAL HIGH (ref 15–41)
AST: 55 U/L — AB (ref 15–41)
Albumin: 2.1 g/dL — ABNORMAL LOW (ref 3.5–5.0)
Alkaline Phosphatase: 54 U/L (ref 38–126)
Anion gap: 6 (ref 5–15)
Anion gap: 5 (ref 5–15)
BILIRUBIN TOTAL: 0.3 mg/dL (ref 0.3–1.2)
BUN: 31 mg/dL — ABNORMAL HIGH (ref 6–20)
BUN: 24 mg/dL — ABNORMAL HIGH (ref 6–20)
CALCIUM: 8.7 mg/dL — AB (ref 8.9–10.3)
CO2: 27 mmol/L (ref 22–32)
CO2: 28 mmol/L (ref 22–32)
CREATININE: 0.78 mg/dL (ref 0.44–1.00)
Calcium: 8.7 mg/dL — ABNORMAL LOW (ref 8.9–10.3)
Chloride: 106 mmol/L (ref 101–111)
Chloride: 107 mmol/L (ref 101–111)
Creatinine, Ser: 0.76 mg/dL (ref 0.44–1.00)
GFR calc Af Amer: >60 mL/min (ref >60)
GFR calc non Af Amer: >60 mL/min (ref >60)
GLUCOSE: 225 mg/dL — AB (ref 65–99)
Glucose, Bld: 164 mg/dL — ABNORMAL HIGH (ref 65–99)
POTASSIUM: 3.2 mmol/L — AB (ref 3.5–5.1)
Potassium: 3 mmol/L — ABNORMAL LOW (ref 3.5–5.1)
SODIUM: 139 mmol/L (ref 135–145)
Sodium: 140 mmol/L (ref 135–145)
TOTAL PROTEIN: 5.9 g/dL — AB (ref 6.5–8.1)
TOTAL PROTEIN: 5.8 g/dL — AB (ref 6.5–8.1)
Total Bilirubin: 0.3 mg/dL (ref 0.3–1.2)

## 2015-10-24 LAB — URINE CULTURE
CULTURE: NO GROWTH
SPECIAL REQUESTS: NORMAL

## 2015-10-24 LAB — CBC
HEMATOCRIT: 34.2 % — AB (ref 35.0–47.0)
HEMOGLOBIN: 11.5 g/dL — AB (ref 12.0–16.0)
MCH: 30.8 pg (ref 26.0–34.0)
MCHC: 33.5 g/dL (ref 32.0–36.0)
MCV: 91.9 fL (ref 80.0–100.0)
Platelets: 193 10*3/uL (ref 150–440)
RBC: 3.72 MIL/uL — ABNORMAL LOW (ref 3.80–5.20)
RDW: 14.7 % — ABNORMAL HIGH (ref 11.5–14.5)
WBC: 9.3 10*3/uL (ref 3.6–11.0)

## 2015-10-24 LAB — GLUCOSE, CAPILLARY
GLUCOSE-CAPILLARY: 181 mg/dL — AB (ref 65–99)
GLUCOSE-CAPILLARY: 162 mg/dL — AB (ref 65–99)
GLUCOSE-CAPILLARY: 154 mg/dL — AB (ref 65–99)
GLUCOSE-CAPILLARY: 137 mg/dL — AB (ref 65–99)
Glucose-Capillary: 217 mg/dL — ABNORMAL HIGH (ref 65–99)

## 2015-10-24 LAB — PROCALCITONIN: Procalcitonin: 0.32 ng/mL

## 2015-10-24 MED ORDER — KCL-LACTATED RINGERS-D5W 20 MEQ/L IV SOLN
INTRAVENOUS | Status: DC
  Administered 2015-10-24 – 2015-10-26 (×2): via INTRAVENOUS
  Filled 2015-10-24 (×6): qty 1000

## 2015-10-24 MED ORDER — ASPIRIN 300 MG RE SUPP
300.0000 mg | Freq: Once | RECTAL | Status: AC
  Administered 2015-10-24: 300 mg via RECTAL
  Filled 2015-10-24: qty 1

## 2015-10-24 MED ORDER — ASPIRIN 81 MG PO CHEW
81.0000 mg | CHEWABLE_TABLET | Freq: Every day | ORAL | Status: DC
Start: 2015-10-25 — End: 2015-10-25
  Administered 2015-10-25: 81 mg via ORAL
  Filled 2015-10-24: qty 1

## 2015-10-24 MED ORDER — DONEPEZIL HCL 5 MG PO TABS
5.0000 mg | ORAL_TABLET | Freq: Every day | ORAL | Status: DC
  Administered 2015-10-25 – 2015-10-26 (×2): 5 mg via ORAL
  Filled 2015-10-24 (×3): qty 1

## 2015-10-24 MED ORDER — DEXTROSE 5 % IV SOLN
1.0000 g | INTRAVENOUS | Status: DC
  Administered 2015-10-24 – 2015-10-26 (×3): 1 g via INTRAVENOUS
  Filled 2015-10-24 (×4): qty 10

## 2015-10-24 MED ORDER — VALPROATE SODIUM 500 MG/5ML IV SOLN
250.0000 mg | Freq: Two times a day (BID) | INTRAVENOUS | Status: DC
  Administered 2015-10-24 – 2015-10-25 (×3): 250 mg via INTRAVENOUS
  Filled 2015-10-24 (×4): qty 2.5

## 2015-10-24 MED ORDER — CLOPIDOGREL BISULFATE 75 MG PO TABS
75.0000 mg | ORAL_TABLET | Freq: Every day | ORAL | Status: DC
  Administered 2015-10-25 – 2015-10-27 (×3): 75 mg via ORAL
  Filled 2015-10-24 (×3): qty 1

## 2015-10-24 MED ORDER — POTASSIUM CHLORIDE 10 MEQ/100ML IV SOLN
10.0000 meq | INTRAVENOUS | Status: AC
  Administered 2015-10-24 (×4): 10 meq via INTRAVENOUS
  Filled 2015-10-24 (×4): qty 100

## 2015-10-24 MED ORDER — SODIUM CHLORIDE 0.9 % IV SOLN: 500.0000 mg | Freq: Three times a day (TID) | INTRAVENOUS | Status: DC

## 2015-10-24 MED ORDER — ROSUVASTATIN CALCIUM 20 MG PO TABS
20.0000 mg | ORAL_TABLET | Freq: Every day | ORAL | Status: DC
  Administered 2015-10-25 – 2015-10-26 (×2): 20 mg via ORAL
  Filled 2015-10-24: qty 2
  Filled 2015-10-24 (×2): qty 1

## 2015-10-24 NOTE — Progress Notes (Signed)
PULMONARY / CRITICAL CARE MEDICINE   Name: Debra Hoffman MRN: [id number] DOB: [birth year]    ADMISSION DATE:  10/22/2015 CONSULTATION DATE:  10/23/15  PT PROFILE: 16 F brought to ED for AMS and intubated for same. Small elevation in trop I noted  MAJOR EVENTS/TEST RESULTS: 10/28 CT head: NAD 10/28 CT chest: Patchy bilateral airspace process likely multifocal pneumonia. 1.3 cm precarinal lymph node likely reactive. 10/29 TTE: LVEF 65%. Ggrade I diastolic dysfunction 12/45 CT head (repeat):   INDWELLING DEVICES:: ETT 10/28 >> 10/30  MICRO DATA: Urine 10/28 >> NEG Resp 10/28 (ordered) >> no result >>     ANTIMICROBIALS:  Azithromycin 10/28 >>  Ceftriaxone 10/28 >>    SUBJECTIVE:  RASS -2 but F/C. Cough with ETS. Passed SBT and extubated. No distress post extubation  VITAL SIGNS: Temp:  [98.1 F (36.7 C)-100.9 F (38.3 C)] 98.5 F (36.9 C) (10/30 1100) Pulse Rate:  [71-94] 80 (10/30 1300) Resp:  [8-31] 28 (10/30 1300) BP: (110-153)/(63-102) 143/90 mmHg (10/30 1300) SpO2:  [93 %-100 %] 97 % (10/30 1300) FiO2 (%):  [28 %] 28 % (10/30 0900) Weight:  [66.9 kg (147 lb 7.8 oz)-68.1 kg (150 lb 2.1 oz)] 66.9 kg (147 lb 7.8 oz) (10/30 0350) HEMODYNAMICS:   VENTILATOR SETTINGS: Vent Mode:  [-] PSV FiO2 (%):  [28 %] 28 % Set Rate:  [12 bmp] 12 bmp Vt Set:  [500 mL] 500 mL PEEP:  [5 cmH20] 5 cmH20 Pressure Support:  [5 cmH20-10 cmH20] 5 cmH20 INTAKE / OUTPUT:  Intake/Output Summary (Last 24 hours) at 10/24/15 1417 Last data filed at 10/24/15 1255  Gross per 24 hour  Intake 2722.5 ml  Output    500 ml  Net 2222.5 ml    PHYSICAL EXAMINATION: General: RASS -2, + F/C Neuro: PERRL, R facial droop, diffusely weak, RUE appears weaker than LUE HEENT: NCAT, WNL Cardiovascular: regular, no M noted Lungs: clear anteriorly Abdomen: Soft, NT, +BS Ext: warm, no edema  LABS:  CBC  Recent Labs Lab 10/22/15 1300 10/23/15 0447 10/24/15 0503  WBC 11.1* 12.7* 9.3  HGB  13.5 12.5 11.5*  HCT 39.3 37.6 34.2*  PLT 195 188 193   Coag's  Recent Labs Lab 10/23/15 0151  INR 1.25   BMET  Recent Labs Lab 10/22/15 1300 10/23/15 0447 10/24/15 0503  NA 140 142 139  K 3.9 3.0* 3.0*  CL 102 103 106  CO2 26 26 27   BUN 37* 33* 31*  CREATININE 1.20* 0.92 0.78  GLUCOSE 140* 96 225*   Electrolytes  Recent Labs Lab 10/22/15 1300 10/23/15 0447 10/23/15 1442 10/24/15 0503  CALCIUM 9.3 9.0  --  8.7*  MG  --   --  1.9  --    Sepsis Markers  Recent Labs Lab 10/22/15 1300 10/22/15 1519 10/24/15 0503  LATICACIDVEN 1.7 2.0  --   PROCALCITON  --   --  0.32   ABG  Recent Labs Lab 10/22/15 1740 10/22/15 1958  PHART 7.52* 7.60*  PCO2ART 37 29*  PO2ART 106 133*   Liver Enzymes  Recent Labs Lab 10/22/15 1300 10/24/15 0503  AST 28 93*  ALT 14 57*  ALKPHOS 58 54  BILITOT 1.2 0.3  ALBUMIN 2.8* 2.1*   Cardiac Enzymes  Recent Labs Lab 10/23/15 0151 10/23/15 0447 10/23/15 1442  TROPONINI 0.05* 0.07* 0.08*   Glucose  Recent Labs Lab 10/23/15 1604 10/23/15 1931 10/23/15 2333 10/24/15 0334 10/24/15 0722 10/24/15 1352  GLUCAP 137* 175* 167* 181* 217* 162*  PCT - 0.32  CXR: RLL ATX   ASSESSMENT / PLAN:  PULMONARY A: Acute resp failure due to AMS Pulmonary infiltrate - suspected CAP RLL Atx - likely mucus plugging P:   Monitor in ICU post extubation Supplemental O2 to maintain SpO2 > 90% NTS PRN  CARDIOVASCULAR A: Minimal elevation of trop I  P:  Monitor  RENAL A:  Hypokalemia, persitent P: Monitor BMET intermittently Monitor I/Os Correct electrolytes as indicated  GI: A: Post extubation dysphagia P: SUP: N/I post extubation NPO for now Given suspicion for CVA, should probably have formal SLP eval Maintenance IVFs ordered until taking POs  HEMATOLOGIC A:  Mild anemia without acute blood loss P:  DVT px: SCDs Monitor CBC intermittently Transfuse per usual ICU guidelines  INFECTIOUS A:    Presumed CAP P:   Monitor temp, WBC count Micro and abx as above  ENDOCRINE A:   Hyperglycemia without prior dx of DM P:   Monitor glu intermittently Consider SSI if glu > 180 consistently  NEUROLOGIC A:  H/O CVA Possible acute CVA Acute hypoactive delirium of unclear etiology P:   RASS goal: 0 Minimize sedating medications Repeat CT head 10/31 ordered  CCM time: 40 mins The above time includes time spent in consultation with patient and/or family members and reviewing care plan on multidisciplinary rounds  Merton Border, MD PCCM service Mobile [phone] Pager [phone]     10/24/2015, 2:17 PM

## 2015-10-24 NOTE — Progress Notes (Signed)
Pt. Extubated and placed on 2 liters nasal cannula,sat 97%,rr 16,no apparent distress noted at this time.

## 2015-10-24 NOTE — Progress Notes (Signed)
Nutrition Follow-up    INTERVENTION:   EN: recommend continuing current TF regimen at current goal rate as per order set. Potassium low today post initiation of TF, recommend supplementing and checking magnesium and phosphorus. Continue to assess  NUTRITION DIAGNOSIS:   Inadequate oral intake related to acute illness as evidenced by NPO status. Being addressed via TF  GOAL:   Provide needs based on ASPEN/SCCM guidelines  MONITOR:    (Energy Intake, Anthropometrics, Electrolyte/Renal Profile, Glucose Profile, Digestive System, Pulmonary)  REASON FOR ASSESSMENT:   Consult, Ventilator Enteral/tube feeding initiation and management  ASSESSMENT:    Pt remains on vent  EN: tolerating Vital High Protein at rate of 55 ml/hr  Digestive System: no signs of TF intolerance, 10 mL residuals, no BM, abdomen soft, BS hypoactive  Electrolyte and Renal Profile:  Recent Labs Lab 10/22/15 1300 10/23/15 0447 10/23/15 1442 10/24/15 0503  BUN 37* 33*  --  31*  CREATININE 1.20* 0.92  --  0.78  NA 140 142  --  139  K 3.9 3.0*  --  3.0*  MG  --   --  1.9  --    Glucose Profile:  Recent Labs  10/23/15 2333 10/24/15 0334 10/24/15 0722  GLUCAP 167* 181* 217*   Nutritional Anemia Profile:  CBC Latest Ref Rng 10/24/2015 10/23/2015 10/22/2015  WBC 3.6 - 11.0 K/uL 9.3 12.7(H) 11.1(H)  Hemoglobin 12.0 - 16.0 g/dL 11.5(L) 12.5 13.5  Hematocrit 35.0 - 47.0 % 34.2(L) 37.6 39.3  Platelets 150 - 440 K/uL 193 188 195    Meds: fentanyl, miralax prn  Height:   Ht Readings from Last 1 Encounters:  10/22/15 5\' 4"  (1.626 m)    Weight:   Wt Readings from Last 1 Encounters:  10/24/15 147 lb 7.8 oz (66.9 kg)    Filed Weights   10/22/15 2200 10/23/15 2200 10/24/15 0350  Weight: 147 lb 11.3 oz (67 kg) 150 lb 2.1 oz (68.1 kg) 147 lb 7.8 oz (66.9 kg)    BMI:  Body mass index is 25.3 kg/(m^2).  Estimated Nutritional Needs:   Kcal:  1374 kcals (BEE 1165, Ve: 6.1, Tmax: 37.6) using wt  of 67 kg  Protein:  101-134 g (1.5-2.0 g/kg)   Fluid:  1675-2010 mL (25-30 ml/kg)   HIGH Care Level  Kerman Passey MS, RD, LDN [phone] Pager

## 2015-10-24 NOTE — Consult Note (Signed)
Reason for Consult:elevated troponin  Referring Physician: Dr. Birder Robson is an 74 y.o. female.   HPI: The patient is a 74 yo woman who was admitted with altered mental status and intubated for airway protection. She has been extubated. The etiology of her altered mentation is unclear. She remains unresponsive. She was noted to have a slight troponin elevation. We are asked to comment. She cannot provide any history. She has a remote h/o CAD and stroke though I have no additional information from the epic.   PMH: Past Medical History  Diagnosis Date  . Dysuria   . Seizures ([place])   . Insomnia   . Wheelchair dependence   . Pressure ulcer of foot, stage 2   . Urinary incontinence   . Coronary artery disease   . Stroke ([place])   . Hypertension   . Glaucoma     PSHX: Past Surgical History  Procedure Laterality Date  . Cardiac surgery    . Coronary artery bypass graft      FAMHX:History reviewed. No pertinent family history.  Social History:  has no tobacco, alcohol, and drug history on file.  Allergies:  Allergies  Allergen Reactions  . Shellfish Allergy     Reaction: unknown    Medications: I have reviewed the patient's current medications.  Dg Abd 1 View  10/23/2015  CLINICAL DATA:  74 year old female with no given history. EXAM: ABDOMEN - 1 VIEW COMPARISON:  Chest x-ray 10/22/2015, CT chest 10/22/2015 FINDINGS: Gas within small bowel and colon. Formed stool within the right colon. Respiratory motion somewhat limits evaluation the abdomen. Tip of gastric tube terminates over the midline, at the level of L1. Side port terminates likely above the gastroesophageal junction. No displaced fracture.  Degenerative changes of the spine. IMPRESSION: Tip of gastric tube terminates in the midline, at the level of L1. Catheter may be advanced further for better positioning in the stomach. Signed, Dulcy Fanny. Earleen Newport, DO [facility]  [facility] Electronically Signed   By: Corrie Mckusick D.O.   On: 10/23/2015 20:44   Ct Head Wo Contrast  10/22/2015  CLINICAL DATA:  Lethargy, unresponsive, UTI, cough, and congestion beginning 3 days ago EXAM: CT HEAD WITHOUT CONTRAST TECHNIQUE: Contiguous axial images were obtained from the base of the skull through the vertex without intravenous contrast. COMPARISON:  None FINDINGS: Initial motion artifacts, for which repeat imaging was performed. Generalized atrophy with prominence of subarachnoid space. No midline shift or mass effect. Difficult to exclude BILATERAL subdural hygromas, with mild flattening of the hemispheres particularly in the frontal regions. Small vessel chronic ischemic changes of deep cerebral white matter with question small periventricular white matter infarcts. No intracranial hemorrhage, mass lesion or evidence of acute infarction. Atherosclerotic calcification of internal carotid and vertebral arteries at skullbase. Partial opacification of ethmoid air cells and sphenoid sinus as well as small air-fluid levels in the maxillary sinuses. No acute calvarial abnormalities. IMPRESSION: Atrophy with small vessel chronic ischemic changes of deep cerebral white matter. Suspected small periventricular white matter infarcts bilaterally. Question small chronic subdural hygromas bilaterally. No definite acute intracranial abnormalities. Electronically Signed   By: Lavonia Dana M.D.   On: 10/22/2015 15:50   Ct Chest W Contrast  10/22/2015  CLINICAL DATA:  Patient lethargic and unresponsive rate recent UTI, cough and congestion beginning 3 days ago. EXAM: CT CHEST WITH CONTRAST TECHNIQUE: Multidetector CT imaging of the chest was performed during intravenous contrast administration. CONTRAST:  75 mL Omnipaque  300 IV. COMPARISON:  Chest x-ray 10/22/2015 FINDINGS: The lungs are adequately inflated demonstrate bibasilar consolidation within the lower lobes right worse than left. There  is subtle patchy nodular airspace disease over the right upper lobe and minimally over the lingula and left upper lobe as findings suggest multifocal pneumonia. No significant effusion. Airways are within normal. Median sternotomy wires are present. Heart is normal size. Moderate increased density over the left main, left anterior descending and right coronary arteries. 1.3 cm lymph node over the precarinal region likely reactive. No significant hilar or axillary adenopathy. Minimal calcified plaque over the thoracic aorta. Remaining mediastinal structures are within normal. Images through the upper abdomen demonstrate calcified granuloma over the right lobe of the liver. 1 cm hypodensity over the inferior aspect of the right lobe of the liver near the gallbladder fossa likely a cyst. 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 1.4 cm exophytic hypodensity over the mid to upper pole of the left kidney likely a cyst. Possible small stone over the upper pole right kidney. Mild calcified plaque over the abdominal aorta. Mild degenerate change of the spine. IMPRESSION: Patchy bilateral airspace process likely multifocal pneumonia. 1.3 cm precarinal lymph node likely reactive. 1.4 cm left renal cyst. Possible small stone over the upper pole right kidney. Couple small sub cm liver hypodensities likely cysts. 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 Electronically Signed   By: Marin Olp M.D.   On: 10/22/2015 15:53   Dg Chest Port 1 View  10/24/2015  CLINICAL DATA:  Respiratory failure. EXAM: PORTABLE CHEST 1 VIEW COMPARISON:  10/22/2015 FINDINGS: ET tube tip is above the carina. Heart size is normal. There is worsening aeration at the right base. Mild opacity in the left base is stable. IMPRESSION: 1. Worsening aeration to the right lung base. Electronically Signed   By: Kerby Moors M.D.   On: 10/24/2015 08:25   Dg Chest Portable 1 View  10/22/2015  CLINICAL DATA:  Post intubation EXAM: PORTABLE CHEST 1  VIEW COMPARISON:  CT chest dated 10/22/2015 FINDINGS: Endotracheal tube terminates 6 cm above the carina. Patchy bilateral lower lobe opacities, atelectasis versus pneumonia. Mild elevation of the right hemidiaphragm. No pneumothorax. The heart is normal in size.  Median sternotomy. Enteric tube courses below the diaphragm. IMPRESSION: Endotracheal tube terminates 6 cm above the carina. Mild patchy bilateral lower lobe opacities, atelectasis versus pneumonia. Electronically Signed   By: Julian Hy M.D.   On: 10/22/2015 19:53    ROS  As stated in the HPI and negative for all other systems.  Physical Exam  Vitals:Blood pressure 110/63, pulse 78, temperature 98.1 F (36.7 C), temperature source Oral, resp. rate 31, height 5\' 4"  (1.626 m), weight 147 lb 7.8 oz (66.9 kg), SpO2 96 %.  Unresponsive edentulous, NAD HEENT: eyes closed Neck:  7 cm JVD, no thyromegally Lymphatics:  No adenopathy Back:  No CVA tenderness Lungs:  Clear with no wheezes HEART:  Regular rate rhythm, no murmurs, no rubs, no clicks Abd:  soft, positive bowel sounds, no organomegally, no rebound, no guarding Ext:  2 plus pulses, no edema, no cyanosis, no clubbing Skin:  No rashes no nodules Neuro:  Does not withdraw to painful stimuli  ECG - NSR  Labs - reviewed  Assessment/Plan: 1. Elevated troponin - her levels are minimally elevated. There is currently no need to work this up further. This is the least of her problems. 2. Altered mental status 3. HTN with normal blood pressures today  Rec: I have nothing to offer this patient. There is currently no need to do any additional workup unless her clinical status changes. Real issue is the etiology of her altered mentation. Please call again for questions.   Carleene Overlie TaylorMD 10/24/2015, 1:16 PM

## 2015-10-24 NOTE — Progress Notes (Signed)
[facility] NAME: Raynee Mccasland    MR#:  [id number]  DATE OF BIRTH:  04/05/1941  SUBJECTIVE:  CHIEF COMPLAINT:   Chief Complaint  Patient presents with  . Altered Mental Status   patient is 74 year old African-American female who presents to the hospital with altered mental status, increasing weakness and  Right facial droop. In ER, she was noted to be hypotensive, was initiated on BiPAP, however, was intubated because of hypoxemia  and inability to protect her airways. She was given IV fluids, antibiotics.  Her blood pressure has improved as well as her oxygenation. She apparently responded a little bit better today, was able to follow commands and was extubated, although after extubation, she remains very somnolent, barely opens her eyes, nonverbal   Review of Systems  Unable to perform ROS: mental acuity    VITAL SIGNS: Blood pressure 110/63, pulse 78, temperature 98.1 F (36.7 C), temperature source Oral, resp. rate 31, height 5\' 4"  (1.626 m), weight 66.9 kg (147 lb 7.8 oz), SpO2 96 %.  PHYSICAL EXAMINATION:   GENERAL:  74 y.o.-year-old patient lying in the bed with no acute distress. Somnolent. Pupils are 1 mm, reactive to light, right facial droop, although patient is leaning her head towards the right EYES: Pupils equal, round, reactive to light and accommodation. No scleral icterus. Extraocular muscles intact.  HEENT: Head atraumatic, normocephalic. Oropharynx and nasopharynx clear. Edentulous .  NECK:  Supple, no jugular venous distention. No thyroid enlargement, no tenderness.  LUNGS: Diminished breath sounds bilaterally at bases, no wheezing, rales,rhonchi or crepitation. No use of accessory muscles of respiration. Poor effort CARDIOVASCULAR: S1, S2 normal. No murmurs, rubs, or gallops.  ABDOMEN: Soft, nontender, nondistended. Bowel sounds present. No organomegaly or mass.  EXTREMITIES: No pedal edema, cyanosis, or  clubbing.  NEUROLOGIC: Cranial nerves not able to evaluate,  some right facial droop, but the head is being dictated to the right. Muscle strength not able to evaluate, although able to move all extremities to painful stimuli. Sensation not able to evaluate. Gait not checked.  PSYCHIATRIC: The patient is very somnolent, barely awakened opens her eyes briefly and didn't get his down to sleep. Nonverbal, not following commands.  SKIN: No obvious rash, lesion, or ulcer.   ORDERS/RESULTS REVIEWED:   CBC  Recent Labs Lab 10/22/15 1300 10/23/15 0447 10/24/15 0503  WBC 11.1* 12.7* 9.3  HGB 13.5 12.5 11.5*  HCT 39.3 37.6 34.2*  PLT 195 188 193  MCV 91.4 92.8 91.9  MCH 31.3 30.8 30.8  MCHC 34.2 33.2 33.5  RDW 14.4 14.4 14.7*  LYMPHSABS 0.9*  --   --   MONOABS 1.2*  --   --   EOSABS 0.0  --   --   BASOSABS 0.1  --   --    ------------------------------------------------------------------------------------------------------------------  Chemistries   Recent Labs Lab 10/22/15 1300 10/23/15 0447 10/23/15 1442 10/24/15 0503  NA 140 142  --  139  K 3.9 3.0*  --  3.0*  CL 102 103  --  106  CO2 26 26  --  27  GLUCOSE 140* 96  --  225*  BUN 37* 33*  --  31*  CREATININE 1.20* 0.92  --  0.78  CALCIUM 9.3 9.0  --  8.7*  MG  --   --  1.9  --   AST 28  --   --  93*  ALT 14  --   --  57*  ALKPHOS 58  --   --  54  BILITOT 1.2  --   --  0.3   ------------------------------------------------------------------------------------------------------------------ estimated creatinine clearance is 59.8 mL/min (by C-G formula based on Cr of 0.78). ------------------------------------------------------------------------------------------------------------------  Recent Labs  10/23/15 0151  TSH 1.943    Cardiac Enzymes  Recent Labs Lab 10/23/15 0151 10/23/15 0447 10/23/15 1442  TROPONINI 0.05* 0.07* 0.08*    ------------------------------------------------------------------------------------------------------------------ Invalid input(s): POCBNP ---------------------------------------------------------------------------------------------------------------  RADIOLOGY: Dg Abd 1 View  10/23/2015  CLINICAL DATA:  74 year old female with no given history. EXAM: ABDOMEN - 1 VIEW COMPARISON:  Chest x-ray 10/22/2015, CT chest 10/22/2015 FINDINGS: Gas within small bowel and colon. Formed stool within the right colon. Respiratory motion somewhat limits evaluation the abdomen. Tip of gastric tube terminates over the midline, at the level of L1. Side port terminates likely above the gastroesophageal junction. No displaced fracture.  Degenerative changes of the spine. IMPRESSION: Tip of gastric tube terminates in the midline, at the level of L1. Catheter may be advanced further for better positioning in the stomach. Signed, Dulcy Fanny. Earleen Newport, DO [facility] Radiology Electronically Signed   By: Corrie Mckusick D.O.   On: 10/23/2015 20:44   Ct Head Wo Contrast  10/22/2015  CLINICAL DATA:  Lethargy, unresponsive, UTI, cough, and congestion beginning 3 days ago EXAM: CT HEAD WITHOUT CONTRAST TECHNIQUE: Contiguous axial images were obtained from the base of the skull through the vertex without intravenous contrast. COMPARISON:  None FINDINGS: Initial motion artifacts, for which repeat imaging was performed. Generalized atrophy with prominence of subarachnoid space. No midline shift or mass effect. Difficult to exclude BILATERAL subdural hygromas, with mild flattening of the hemispheres particularly in the frontal regions. Small vessel chronic ischemic changes of deep cerebral white matter with question small periventricular white matter infarcts. No intracranial hemorrhage, mass lesion or evidence of acute infarction. Atherosclerotic calcification of internal carotid and  vertebral arteries at skullbase. Partial opacification of ethmoid air cells and sphenoid sinus as well as small air-fluid levels in the maxillary sinuses. No acute calvarial abnormalities. IMPRESSION: Atrophy with small vessel chronic ischemic changes of deep cerebral white matter. Suspected small periventricular white matter infarcts bilaterally. Question small chronic subdural hygromas bilaterally. No definite acute intracranial abnormalities. Electronically Signed   By: Lavonia Dana M.D.   On: 10/22/2015 15:50   Ct Chest W Contrast  10/22/2015  CLINICAL DATA:  Patient lethargic and unresponsive rate recent UTI, cough and congestion beginning 3 days ago. EXAM: CT CHEST WITH CONTRAST TECHNIQUE: Multidetector CT imaging of the chest was performed during intravenous contrast administration. CONTRAST:  75 mL Omnipaque 300 IV. COMPARISON:  Chest x-ray 10/22/2015 FINDINGS: The lungs are adequately inflated demonstrate bibasilar consolidation within the lower lobes right worse than left. There is subtle patchy nodular airspace disease over the right upper lobe and minimally over the lingula and left upper lobe as findings suggest multifocal pneumonia. No significant effusion. Airways are within normal. Median sternotomy wires are present. Heart is normal size. Moderate increased density over the left main, left anterior descending and right coronary arteries. 1.3 cm lymph node over the precarinal region likely reactive. No significant hilar or axillary adenopathy. Minimal calcified plaque over the thoracic aorta. Remaining mediastinal structures are within normal. Images through the upper abdomen demonstrate calcified granuloma over the right lobe of the liver. 1 cm hypodensity over the inferior aspect of the right lobe of the liver near the gallbladder fossa likely a cyst. 2.5 cm left adrenal mass likely  an adenoma. 1.4 cm exophytic hypodensity over the mid to upper pole of the left kidney likely a cyst. Possible  small stone over the upper pole right kidney. Mild calcified plaque over the abdominal aorta. Mild degenerate change of the spine. IMPRESSION: Patchy bilateral airspace process likely multifocal pneumonia. 1.3 cm precarinal lymph node likely reactive. 1.4 cm left renal cyst. Possible small stone over the upper pole right kidney. Couple small sub cm liver hypodensities likely cysts. 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 Electronically Signed   By: Marin Olp M.D.   On: 10/22/2015 15:53   Dg Chest Port 1 View  10/24/2015  CLINICAL DATA:  Respiratory failure. EXAM: PORTABLE CHEST 1 VIEW COMPARISON:  10/22/2015 FINDINGS: ET tube tip is above the carina. Heart size is normal. There is worsening aeration at the right base. Mild opacity in the left base is stable. IMPRESSION: 1. Worsening aeration to the right lung base. Electronically Signed   By: Kerby Moors M.D.   On: 10/24/2015 08:25   Dg Chest Portable 1 View  10/22/2015  CLINICAL DATA:  Post intubation EXAM: PORTABLE CHEST 1 VIEW COMPARISON:  CT chest dated 10/22/2015 FINDINGS: Endotracheal tube terminates 6 cm above the carina. Patchy bilateral lower lobe opacities, atelectasis versus pneumonia. Mild elevation of the right hemidiaphragm. No pneumothorax. The heart is normal in size.  Median sternotomy. Enteric tube courses below the diaphragm. IMPRESSION: Endotracheal tube terminates 6 cm above the carina. Mild patchy bilateral lower lobe opacities, atelectasis versus pneumonia. Electronically Signed   By: Julian Hy M.D.   On: 10/22/2015 19:53   Dg Chest Portable 1 View  10/22/2015  CLINICAL DATA:  Altered mental status. Urinary tract infection. Initial encounter. EXAM: PORTABLE CHEST 1 VIEW COMPARISON:  None. FINDINGS: Midline sternotomy wires overlie normal cardiac silhouette. There is a RIGHT infrahilar density in measuring 4 cm. Small RIGHT effusion. Upper lobes are clear. IMPRESSION: RIGHT infrahilar density with differential  including of pneumonia versus mass. Consider CT thorax contrast for further evaluation. Electronically Signed   By: Suzy Bouchard M.D.   On: 10/22/2015 13:19    EKG:  Orders placed or performed during the hospital encounter of 10/22/15  . ED EKG  . ED EKG  . EKG 12-Lead  . EKG 12-Lead    ASSESSMENT AND PLAN:  Active Problems:   Pneumonia 1. Acute respiratory failure with hypoxia due to multifocal pneumonia, now extubated, questionable reintubation due to difficulty to protect airways? , appreciate pulmonary input, continue oxygen as needed to keep pulse oximeter at around 94%. Patient is off sedation, per nursing staff, but is still not following commands, following clinically. Get palliative care involvement on Monday to discuss case with family and follow along. Getting CT of the head tomorrow morning to rule out stroke 2. Multifocal pneumonia. Continue patient Rocephin and Zithromax. Follow sputum cultures. Adjust antibiotics depending on culture results. Rule out aspiration , get Speech therapist involved whenever able. Most recent chest x-ray revealed worsening right base atelectasis, consolidation, but white blood cell count remained stable and patient remains afebrile. Her oxygenation is also stable. Procalcitonin is mildly elevated 3. Pyuria, unlikely urinary tract infection. Negative urine cultures.  4. Acute encephalopathy, suspected due to metabolic arrangements including hypoxia, infection, however, patient does not respond , although sedation is off, continue neuro checks, get CT scan of head without contrast tomorrow morning, awaiting for palliative care intervention and  discussion with family 5. Renal insufficiency. Resolved on IV fluids. Follow kidney function closely 6.  Diabetes mellitus. Continue metformin and the sliding scale insulin, blood glucose levels are between 100s to 120s 7. Leukocytosis. Stable with antibiotic therapy 8. Elevated troponin, likely due to hypoxia  demand ischemia. Stable troponins 3, continue patient on aspirin therapy rectally. Nitroglycerin topically and metoprolol intravenously, getting the echocardiogram and cardiologist involved. Further evaluation and intervention depending on her neurologic status   Management plans discussed with the patient, family and they are in agreement.   DRUG ALLERGIES:  Allergies  Allergen Reactions  . Shellfish Allergy     Reaction: unknown    CODE STATUS:     Code Status Orders        Start     Ordered   10/22/15 2157  Full code   Continuous     10/22/15 2156      TOTAL CRITICAL CARE TIME TAKING CARE OF THIS PATIENT: 45 minutes.    Theodoro Grist M.D on 10/24/2015 at 11:46 AM  Between 7am to 6pm - Pager - [phone]  After 6pm go to [email or web address] - password [email or web address] [facility]  [facility]  Office  [phone]  CC: Primary care physician; No primary care provider on file.

## 2015-10-24 NOTE — Progress Notes (Signed)
Patient was extubated around 1000. Remains lethargic, arousable to voice, nods/shakes head appropriately to questions. 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 Potassium low, replacement IV and added to IVF per MD order. Foley in place draining to bag. Resting quietly between care. Will continue to monitor.

## 2015-10-25 ENCOUNTER — Inpatient Hospital Stay: Payer: Medicare Other

## 2015-10-25 LAB — GLUCOSE, CAPILLARY
GLUCOSE-CAPILLARY: 145 mg/dL — AB (ref 65–99)
GLUCOSE-CAPILLARY: 148 mg/dL — AB (ref 65–99)
GLUCOSE-CAPILLARY: 98 mg/dL (ref 65–99)

## 2015-10-25 LAB — CBC
HCT: 33.2 % — ABNORMAL LOW (ref 35.0–47.0)
HEMOGLOBIN: 11.5 g/dL — AB (ref 12.0–16.0)
MCH: 31.7 pg (ref 26.0–34.0)
MCHC: 34.6 g/dL (ref 32.0–36.0)
MCV: 91.7 fL (ref 80.0–100.0)
PLATELETS: 218 10*3/uL (ref 150–440)
RBC: 3.62 MIL/uL — AB (ref 3.80–5.20)
RDW: 14.8 % — ABNORMAL HIGH (ref 11.5–14.5)
WBC: 10.4 10*3/uL (ref 3.6–11.0)

## 2015-10-25 LAB — BASIC METABOLIC PANEL
ANION GAP: 6 (ref 5–15)
BUN: 19 mg/dL (ref 6–20)
CALCIUM: 8.7 mg/dL — AB (ref 8.9–10.3)
CO2: 27 mmol/L (ref 22–32)
CREATININE: 0.74 mg/dL (ref 0.44–1.00)
Chloride: 108 mmol/L (ref 101–111)
Glucose, Bld: 151 mg/dL — ABNORMAL HIGH (ref 65–99)
Potassium: 3 mmol/L — ABNORMAL LOW (ref 3.5–5.1)
Sodium: 141 mmol/L (ref 135–145)

## 2015-10-25 LAB — POTASSIUM: POTASSIUM: 3.7 mmol/L (ref 3.5–5.1)

## 2015-10-25 LAB — VALPROIC ACID LEVEL: VALPROIC ACID LVL: 38 ug/mL — AB (ref 50.0–100.0)

## 2015-10-25 LAB — MAGNESIUM: Magnesium: 1.7 mg/dL (ref 1.7–2.4)

## 2015-10-25 MED ORDER — CHLORHEXIDINE GLUCONATE 0.12 % MT SOLN
15.0000 mL | Freq: Two times a day (BID) | OROMUCOSAL | Status: DC
  Administered 2015-10-25 – 2015-10-26 (×3): 15 mL via OROMUCOSAL
  Filled 2015-10-25 (×4): qty 15

## 2015-10-25 MED ORDER — POTASSIUM CHLORIDE 10 MEQ/100ML IV SOLN
10.0000 meq | INTRAVENOUS | Status: AC
  Administered 2015-10-25 (×4): 10 meq via INTRAVENOUS
  Filled 2015-10-25 (×5): qty 100

## 2015-10-25 MED ORDER — CLONIDINE HCL 0.2 MG/24HR TD PTWK
0.2000 mg | MEDICATED_PATCH | TRANSDERMAL | Status: DC
  Administered 2015-10-25: 0.2 mg via TRANSDERMAL
  Filled 2015-10-25: qty 1

## 2015-10-25 MED ORDER — DIVALPROEX SODIUM 250 MG PO DR TAB
250.0000 mg | DELAYED_RELEASE_TABLET | Freq: Two times a day (BID) | ORAL | Status: DC
  Administered 2015-10-25 – 2015-10-26 (×3): 250 mg via ORAL
  Filled 2015-10-25 (×3): qty 1

## 2015-10-25 MED ORDER — LEVETIRACETAM 500 MG PO TABS
500.0000 mg | ORAL_TABLET | Freq: Two times a day (BID) | ORAL | Status: DC
  Administered 2015-10-26 – 2015-10-27 (×3): 500 mg via ORAL
  Filled 2015-10-25 (×3): qty 1

## 2015-10-25 MED ORDER — ASPIRIN 300 MG RE SUPP: 300.0000 mg | Freq: Every day | RECTAL | Status: DC

## 2015-10-25 MED ORDER — PANTOPRAZOLE SODIUM 40 MG PO TBEC
40.0000 mg | DELAYED_RELEASE_TABLET | Freq: Every day | ORAL | Status: DC
  Administered 2015-10-25 – 2015-10-27 (×3): 40 mg via ORAL
  Filled 2015-10-25 (×3): qty 1

## 2015-10-25 MED ORDER — ENOXAPARIN SODIUM 40 MG/0.4ML ~~LOC~~ SOLN
40.0000 mg | SUBCUTANEOUS | Status: DC
  Administered 2015-10-25 – 2015-10-26 (×2): 40 mg via SUBCUTANEOUS
  Filled 2015-10-25 (×2): qty 0.4

## 2015-10-25 MED ORDER — METOPROLOL TARTRATE 25 MG PO TABS
12.5000 mg | ORAL_TABLET | Freq: Two times a day (BID) | ORAL | Status: DC
  Administered 2015-10-25 – 2015-10-27 (×4): 12.5 mg via ORAL
  Filled 2015-10-25 (×5): qty 1

## 2015-10-25 MED ORDER — NITROGLYCERIN 2 % TD OINT
1.0000 [in_us] | TOPICAL_OINTMENT | Freq: Three times a day (TID) | TRANSDERMAL | Status: DC
  Administered 2015-10-25 – 2015-10-27 (×5): 1 [in_us] via TOPICAL
  Filled 2015-10-25 (×5): qty 1

## 2015-10-25 MED ORDER — AZITHROMYCIN 250 MG PO TABS
500.0000 mg | ORAL_TABLET | Freq: Every day | ORAL | Status: DC
  Administered 2015-10-25 – 2015-10-27 (×3): 500 mg via ORAL
  Filled 2015-10-25 (×3): qty 2

## 2015-10-25 NOTE — Clinical Documentation Improvement (Signed)
Internal Medicine Critical Care  After careful study, please clarify the likely etiology of the patient's altered mental status. Please update your documentation within the medical record to reflect your response to this query. Thank you!   AMS likely secondary to Pneumonia, Aspiration Pneumonia, Gram (-) Pneumonia, Other type  AMS likely secondary to an acute CVA - type and site if known  AMS likely secondary to Sepsis - tachypneic, hypotension, febrile, wbc = 12.7, lactate = 1.7, fluid resuscitation and antibiotics initiated(IV Zithromax and Rocephin)  Other Cause  Clinically Undetermined  Please exercise your independent, professional judgment when responding. A specific answer is not anticipated or expected.  Thank You, Zoila Shutter RN, [place] [phone]

## 2015-10-25 NOTE — Progress Notes (Signed)
Pharmacy Consult for Electrolyte Management   Allergies  Allergen Reactions  . Shellfish Allergy     Reaction: unknown    Patient Measurements: Height: 5\' 4"  (162.6 cm) Weight: 151 lb 14.4 oz (68.9 kg) IBW/kg (Calculated) : 54.7  Vital Signs: Temp: 97.8 F (36.6 C) (10/31 1900) Temp Source: Oral (10/31 1900) BP: 148/67 mmHg (10/31 1900) Pulse Rate: 59 (10/31 1900) Intake/Output from previous day: 10/30 0701 - 10/31 0700 In: 1641.7 [I.V.:784.2; IV Piggyback:857.5] Out: 925 [Urine:925] Intake/Output from this shift:    Labs:  Recent Labs  10/23/15 0151 10/23/15 0447 10/24/15 0503 10/25/15 0640  WBC  --  12.7* 9.3 10.4  HGB  --  12.5 11.5* 11.5*  HCT  --  37.6 34.2* 33.2*  PLT  --  188 193 218  INR 1.25  --   --   --      Recent Labs  10/23/15 0151  10/23/15 1442 10/24/15 0503 10/24/15 1908 10/25/15 0640 10/25/15 1822  NA  --   < >  --  139 140 141  --   K  --   < >  --  3.0* 3.2* 3.0* 3.7  CL  --   < >  --  106 107 108  --   CO2  --   < >  --  27 28 27   --   GLUCOSE  --   < >  --  225* 164* 151*  --   BUN  --   < >  --  31* 24* 19  --   CREATININE  --   < >  --  0.78 0.76 0.74  --   CALCIUM  --   < >  --  8.7* 8.7* 8.7*  --   MG  --   --  1.9  --   --   --  1.7  PROT  --   --   --  5.9* 5.8*  --   --   ALBUMIN  --   --   --  2.1* 2.0*  --   --   AST  --   --   --  93* 55*  --   --   ALT  --   --   --  57* 48  --   --   ALKPHOS  --   --   --  54 54  --   --   BILITOT  --   --   --  0.3 0.3  --   --   TRIG 97  --   --   --   --   --   --   < > = values in this interval not displayed. Estimated Creatinine Clearance: 60.6 mL/min (by C-G formula based on Cr of 0.74).    Recent Labs  10/24/15 2011 10/25/15 0007 10/25/15 0729  GLUCAP 137* 148* 145*    Medical History: Past Medical History  Diagnosis Date  . Dysuria   . Seizures ([place])   . Insomnia   . Wheelchair dependence   . Pressure ulcer of foot, stage 2   . Urinary incontinence   .  Coronary artery disease   . Stroke ([place])   . Hypertension   . Glaucoma     Medications:  Scheduled:  . antiseptic oral rinse  7 mL Mouth Rinse QID  . azithromycin  500 mg Oral Daily  . brinzolamide  1 drop Both Eyes TID  . cefTRIAXone (ROCEPHIN)  IV  1 g Intravenous Q24H  . chlorhexidine  15 mL Mouth/Throat BID  . cloNIDine  0.2 mg Transdermal Weekly  . clopidogrel  75 mg Oral Daily  . clotrimazole  1 application Topical BID  . divalproex  250 mg Oral Q12H  . donepezil  5 mg Oral QHS  . enoxaparin (LOVENOX) injection  40 mg Subcutaneous Q24H  . [START ON 10/26/2015] levETIRAcetam  500 mg Oral BID  . metoprolol tartrate  12.5 mg Oral BID  . nitroGLYCERIN  1 inch Topical 3 times per day  . pantoprazole  40 mg Oral Daily  . rosuvastatin  20 mg Oral QHS  . sodium chloride  3 mL Intravenous Q12H   Infusions:  . dextrose 5% lactated ringers with KCl 20 mEq/L 50 mL/hr at 10/24/15 1800    Assessment: Pharmacy consulted to assist in managing electrolytes in this 74 y/o F admitted with AMS and respiratory failure.    Plan:  Potassium is low at 3.0. Patient is receiving K via maintenance IVF, however at the current rate it is only providing 24 meq K daily. Will supplement with KCl 10 meq IV x 4 and recheck labs at 1800.   1031 K= 3.7, this may not be completely accurate due to hemolyses. Will recheck level with AM labs. Mg WNL  Debra Hoffman 10/25/2015,8:21 PM

## 2015-10-25 NOTE — Progress Notes (Signed)
Palliative Care Consult Update  Palliative Care Consult is initiated.  Pt reportedly lives at home with a daughter and granddaughter who provide near-total ALD care.  Pt can take a few steps to bathroom with assistance at home.  She is sleeping currently.    She is full code status.   Will contact family soon.  See full note to follow.  Colleen Can, MD

## 2015-10-25 NOTE — Progress Notes (Signed)
PULMONARY / CRITICAL CARE MEDICINE   Name: Debra Hoffman MRN: [id number] DOB: [birth year]    ADMISSION DATE:  10/22/2015 CONSULTATION DATE:  10/23/15  PT PROFILE: 62 F brought to ED for AMS and intubated for same. Small elevation in trop I noted  MAJOR EVENTS/TEST RESULTS: 10/28 CT head: NAD 10/28 CT chest: Patchy bilateral airspace process likely multifocal pneumonia. 1.3 cm precarinal lymph node likely reactive. 10/29 TTE: LVEF 65%. Ggrade I diastolic dysfunction 51/76 CT head (repeat):   INDWELLING DEVICES:: ETT 10/28 >> 10/30  MICRO DATA: Urine 10/28 >> NEG Resp 10/28 (ordered) >> no result >>     ANTIMICROBIALS:  Azithromycin 10/28 >>  Ceftriaxone 10/28 >>    SUBJECTIVE:  RASS -2 but F/C. Cough with ETS. Passed SBT and extubated. No distress post extubation  VITAL SIGNS: Temp:  [97.5 F (36.4 C)-99.6 F (37.6 C)] 99.6 F (37.6 C) (10/31 0800) Pulse Rate:  [65-81] 70 (10/31 1100) Resp:  [17-34] 24 (10/31 1100) BP: (120-164)/(53-90) 164/90 mmHg (10/31 1100) SpO2:  [94 %-100 %] 98 % (10/31 1100) Weight:  [151 lb 14.4 oz (68.9 kg)] 151 lb 14.4 oz (68.9 kg) (10/31 0546) HEMODYNAMICS:   VENTILATOR SETTINGS:   INTAKE / OUTPUT:  Intake/Output Summary (Last 24 hours) at 10/25/15 1140 Last data filed at 10/25/15 1100  Gross per 24 hour  Intake 1742.17 ml  Output   1225 ml  Net 517.17 ml    PHYSICAL EXAMINATION: General: RASS -2, + F/C Neuro: PERRL, R facial droop, diffusely weak, RUE appears weaker than LUE HEENT: NCAT, WNL Cardiovascular: regular, no M noted Lungs: clear anteriorly Abdomen: Soft, NT, +BS Ext: warm, no edema  LABS:  CBC  Recent Labs Lab 10/23/15 0447 10/24/15 0503 10/25/15 0640  WBC 12.7* 9.3 10.4  HGB 12.5 11.5* 11.5*  HCT 37.6 34.2* 33.2*  PLT 188 193 218   Coag's  Recent Labs Lab 10/23/15 0151  INR 1.25   BMET  Recent Labs Lab 10/24/15 0503 10/24/15 1908 10/25/15 0640  NA 139 140 141  K 3.0* 3.2* 3.0*  CL  106 107 108  CO2 27 28 27   BUN 31* 24* 19  CREATININE 0.78 0.76 0.74  GLUCOSE 225* 164* 151*   Electrolytes  Recent Labs Lab 10/23/15 1442 10/24/15 0503 10/24/15 1908 10/25/15 0640  CALCIUM  --  8.7* 8.7* 8.7*  MG 1.9  --   --   --    Sepsis Markers  Recent Labs Lab 10/22/15 1300 10/22/15 1519 10/24/15 0503  LATICACIDVEN 1.7 2.0  --   PROCALCITON  --   --  0.32   ABG  Recent Labs Lab 10/22/15 1740 10/22/15 1958  PHART 7.52* 7.60*  PCO2ART 37 29*  PO2ART 106 133*   Liver Enzymes  Recent Labs Lab 10/22/15 1300 10/24/15 0503 10/24/15 1908  AST 28 93* 55*  ALT 14 57* 48  ALKPHOS 58 54 54  BILITOT 1.2 0.3 0.3  ALBUMIN 2.8* 2.1* 2.0*   Cardiac Enzymes  Recent Labs Lab 10/23/15 0151 10/23/15 0447 10/23/15 1442  TROPONINI 0.05* 0.07* 0.08*   Glucose  Recent Labs Lab 10/24/15 0722 10/24/15 1352 10/24/15 1744 10/24/15 2011 10/25/15 0007 10/25/15 0729  GLUCAP 217* 162* 154* 137* 148* 145*   PCT - 0.32  CXR: RLL ATX   ASSESSMENT / PLAN:  PULMONARY A: Acute resp failure due to AMS Pulmonary infiltrate - suspected CAP RLL Atx - likely mucus plugging P:   Now stable off vent, ok to transfer out of ICU.  Supplemental O2 to maintain SpO2 > 90%   CARDIOVASCULAR A: Minimal elevation of trop I  P:  Monitor  RENAL A:  Hypokalemia, persitent P: Monitor BMET intermittently Monitor I/Os Correct electrolytes as indicated  GI: A: Post extubation dysphagia P: SUP: N/I post extubation Advance diet as tolerated.   HEMATOLOGIC A:  Mild anemia without acute blood loss P:  DVT px: SCDs Monitor CBC intermittently Transfuse per usual ICU guidelines  INFECTIOUS A:   Presumed CAP P:   Monitor temp, WBC count Micro and abx as above  ENDOCRINE A:   Hyperglycemia without prior dx of DM P:   Monitor glu intermittently Consider SSI if glu > 180 consistently  NEUROLOGIC A:  H/O CVA Possible acute CVA Acute hypoactive  delirium of unclear etiology P:   RASS goal: 0 Minimize sedating medications Repeat CT head 10/31 ordered  Deep Ashby Dawes, M.D.  PCCM service      10/25/2015, 11:40 AM

## 2015-10-25 NOTE — Care Management Note (Signed)
Case Management Note  Patient Details  Name: Gracious Renken MRN: [id number] Date of Birth: 1941/10/10  Subjective/Objective:   RNCM assessment for discharge planning needs. Admitted with acute respiratory failure and PNA. Extubated on 10/30. Attempted to call daughter n=but her phone states "not taking calls at this time". No family in room to discuss home needs with. Patient lethargic and mumbling.                Action/Plan:  Will follow up with family and daughter to discuss discharge needs and home health. Recommend PT consult.                Expected Discharge Date:                  Expected Discharge Plan:  [place]  In-House Referral:     Discharge planning Services  CM Consult  Post Acute Care Choice:    Choice offered to:     DME Arranged:    DME Agency:     HH Arranged:    [place] Agency:     Status of Service:  In process, will continue to follow  Medicare Important Message Given:    Date Medicare IM Given:    Medicare IM give by:    Date Additional Medicare IM Given:    Additional Medicare Important Message give by:     If discussed at [place] of Stay Meetings, dates discussed:    Additional Comments:  Jolly Mango, RN 10/25/2015, 4:00 PM

## 2015-10-25 NOTE — Progress Notes (Addendum)
[facility] [place] NAME: Debra Hoffman    MR#:  [id number]  DATE OF BIRTH:  [birth year]  SUBJECTIVE:  CHIEF COMPLAINT:   Chief Complaint  Patient presents with  . Altered Mental Status   patient is 74 year old African-American female who presents to the hospital with altered mental status, weakness and  Right facial droop. In ER, she was noted to be hypotensive, was initiated on BiPAP, however, was intubated because of hypoxemia  and inability to protect her airways. She was given IV fluids, antibiotics.  Her blood pressure has improved as well as her oxygenation. Extubated 10.30, remains poorly responsive, mumbling, not FC.    Review of Systems  Unable to perform ROS: mental acuity    VITAL SIGNS: Blood pressure 159/86, pulse 71, temperature 99.6 F (37.6 C), temperature source Oral, resp. rate 22, height 5\' 4"  (1.626 m), weight 68.9 kg (151 lb 14.4 oz), SpO2 98 %.  PHYSICAL EXAMINATION:   GENERAL:  74 y.o.-year-old patient lying in the bed with no acute distress, poorly responsive to verbal stimuli, mumbles, attempts  say her name . Somnolent. Pupils are 1 mm, reactive to light, right facial droop, although patient is leaning her head towards the right EYES: Pupils equal, round, reactive to light and accommodation. No scleral icterus. Extraocular muscles intact.  HEENT: Head atraumatic, normocephalic. Oropharynx and nasopharynx clear. Edentulous .  NECK:  Supple, no jugular venous distention. No thyroid enlargement, no tenderness.  LUNGS: Diminished breath sounds bilaterally at bases, no wheezing, rales,rhonchi or crepitation. No use of accessory muscles of respiration. Poor effort CARDIOVASCULAR: S1, S2 normal. No murmurs, rubs, or gallops.  ABDOMEN: Soft, nontender, nondistended. Bowel sounds present. No organomegaly or mass.  EXTREMITIES: No pedal edema, cyanosis, or clubbing.  NEUROLOGIC: Cranial nerves not able to evaluate,   some right facial droop, but the head is being dictated to the right. Muscle strength not able to evaluate, although able to move all extremities to painful stimuli. Sensation not able to evaluate. Gait not checked.  PSYCHIATRIC: The patient is very somnolent, does not  open her eyes . Nonverbal, not following commands. Mumbles her name though SKIN: No obvious rash, lesion, or ulcer.   ORDERS/RESULTS REVIEWED:   CBC  Recent Labs Lab 10/22/15 1300 10/23/15 0447 10/24/15 0503 10/25/15 0640  WBC 11.1* 12.7* 9.3 10.4  HGB 13.5 12.5 11.5* 11.5*  HCT 39.3 37.6 34.2* 33.2*  PLT 195 188 193 218  MCV 91.4 92.8 91.9 91.7  MCH 31.3 30.8 30.8 31.7  MCHC 34.2 33.2 33.5 34.6  RDW 14.4 14.4 14.7* 14.8*  LYMPHSABS 0.9*  --   --   --   MONOABS 1.2*  --   --   --   EOSABS 0.0  --   --   --   BASOSABS 0.1  --   --   --    ------------------------------------------------------------------------------------------------------------------  Chemistries   Recent Labs Lab 10/22/15 1300 10/23/15 0447 10/23/15 1442 10/24/15 0503 10/24/15 1908 10/25/15 0640  NA 140 142  --  139 140 141  K 3.9 3.0*  --  3.0* 3.2* 3.0*  CL 102 103  --  106 107 108  CO2 26 26  --  27 28 27   GLUCOSE 140* 96  --  225* 164* 151*  BUN 37* 33*  --  31* 24* 19  CREATININE 1.20* 0.92  --  0.78 0.76 0.74  CALCIUM 9.3 9.0  --  8.7* 8.7* 8.7*  MG  --   --  1.9  --   --   --   AST 28  --   --  93* 55*  --   ALT 14  --   --  57* 48  --   ALKPHOS 58  --   --  54 54  --   BILITOT 1.2  --   --  0.3 0.3  --    ------------------------------------------------------------------------------------------------------------------ estimated creatinine clearance is 60.6 mL/min (by C-G formula based on Cr of 0.74). ------------------------------------------------------------------------------------------------------------------  Recent Labs  10/23/15 0151  TSH 1.943    Cardiac Enzymes  Recent Labs Lab 10/23/15 0151  10/23/15 0447 10/23/15 1442  TROPONINI 0.05* 0.07* 0.08*   ------------------------------------------------------------------------------------------------------------------ Invalid input(s): POCBNP ---------------------------------------------------------------------------------------------------------------  RADIOLOGY: Dg Abd 1 View  10/23/2015  CLINICAL DATA:  74 year old female with no given history. EXAM: ABDOMEN - 1 VIEW COMPARISON:  Chest x-ray 10/22/2015, CT chest 10/22/2015 FINDINGS: Gas within small bowel and colon. Formed stool within the right colon. Respiratory motion somewhat limits evaluation the abdomen. Tip of gastric tube terminates over the midline, at the level of L1. Side port terminates likely above the gastroesophageal junction. No displaced fracture.  Degenerative changes of the spine. IMPRESSION: Tip of gastric tube terminates in the midline, at the level of L1. Catheter may be advanced further for better positioning in the stomach. Signed, Dulcy Fanny. Earleen Newport, DO [facility] Radiology Electronically Signed   By: Corrie Mckusick D.O.   On: 10/23/2015 20:44   Dg Chest Port 1 View  10/25/2015  CLINICAL DATA:  Respiratory failure. EXAM: PORTABLE CHEST 1 VIEW COMPARISON:  10/24/2015. FINDINGS: Mediastinum hilar structures normal. Prior median sternotomy and CABG. Cardiomegaly with persistent bibasilar atelectasis and/or infiltrates. Small pleural effusions cannot be excluded. No pneumothorax. No acute osseous abnormality . IMPRESSION: 1. Persistent atelectasis and or infiltrates in the lower lobes. Small pleural effusions cannot be excluded. 2. Prior CABG .  Stable cardiomegaly. Electronically Signed   By: Keo   On: 10/25/2015 07:15   Dg Chest Port 1 View  10/24/2015  CLINICAL DATA:  Respiratory failure. EXAM: PORTABLE CHEST 1 VIEW COMPARISON:  10/22/2015 FINDINGS: ET tube tip is above the carina. Heart size is normal.  There is worsening aeration at the right base. Mild opacity in the left base is stable. IMPRESSION: 1. Worsening aeration to the right lung base. Electronically Signed   By: Kerby Moors M.D.   On: 10/24/2015 08:25    EKG:  Orders placed or performed during the hospital encounter of 10/22/15  . ED EKG  . ED EKG  . EKG 12-Lead  . EKG 12-Lead    ASSESSMENT AND PLAN:  Active Problems:   Pneumonia   Elevated troponin 1. Acute respiratory failure with hypoxia due to multifocal pneumonia, now extubated, continue oxygen as needed to keep pulse oximeter at around 94%. Getting palliative care involved today to discuss case with family and follow along. Getting CT of the head today  to rule out stroke 2. Multifocal pneumonia. Continue patient Rocephin and Zithromax. Follow sputum cultures. Adjust antibiotics depending on culture results. Rule out aspiration , get Speech therapist involved whenever able. Most recent chest x-ray revealed worsening right base atelectasis, consolidation, but white blood cell count remained stable and patient remains afebrile. Her oxygenation is also stable. Procalcitonin is mildly elevated.  3. Pyuria, unlikely urinary tract infection. Negative urine cultures.  4. Acute encephalopathy, suspected multifactorial due to metabolic derangements including hypoxia, infection, and possible CVA, awaiting for palliative care intervention  and  discussion with family 5. Renal insufficiency. Resolved on IV fluids. Follow kidney function closely 6. Diabetes mellitus. Continue accucheks, blood glucose levels are between 100s to 120s, now on d5w  IV drip 7. Leukocytosis. Resolved with antibiotic therapy 8. Elevated troponin, likely due to hypoxia demand ischemia. Stable troponins 3, continue patient on aspirin therapy rectally. Nitroglycerin topically and metoprolol intravenously. Further evaluation and intervention depending on her neurologic status 9 sepsis due to pneumonia, symptoms  resolved, blood cx not done, continue current antibiotics 10 h/o seizures, now on keppra and valproate intravenously, no seizure activity noted   Management plans discussed with the patient, family and they are in agreement.   DRUG ALLERGIES:  Allergies  Allergen Reactions  . Shellfish Allergy     Reaction: unknown    CODE STATUS:     Code Status Orders        Start     Ordered   10/22/15 2157  Full code   Continuous     10/22/15 2156      TOTAL CRITICAL CARE TIME TAKING CARE OF THIS PATIENT: 45 minutes.    Theodoro Grist M.D on 10/25/2015 at 11:00 AM  Between 7am to 6pm - Pager - [phone]  After 6pm go to [email or web address] - password [email or web address] [facility]  [facility]  Office  [phone]  CC: Primary care physician; No primary care provider on file.

## 2015-10-25 NOTE — Progress Notes (Signed)
Nutrition Follow-up     INTERVENTION:   Coordination of care: await diet progression post extubation  NUTRITION DIAGNOSIS:   Inadequate oral intake related to acute illness as evidenced by NPO status.  GOAL:   Provide needs based on ASPEN/SCCM guidelines  MONITOR:    (Energy Intake, Anthropometrics, Electrolyte/Renal Profile, Glucose Profile, Digestive System, Pulmonary)  REASON FOR ASSESSMENT:   Consult, Ventilator Enteral/tube feeding initiation and management  ASSESSMENT:   Pt s/p extubation yesterday, remains poorly responsive, mumbling  Diet Order:   SLP eval pending for diet progression post extubation  Electrolyte and Renal Profile:  Recent Labs Lab 10/23/15 1442 10/24/15 0503 10/24/15 1908 10/25/15 0640  BUN  --  31* 24* 19  CREATININE  --  0.78 0.76 0.74  NA  --  139 140 141  K  --  3.0* 3.2* 3.0*  MG 1.9  --   --   --    Glucose Profile:  Recent Labs  10/24/15 2011 10/25/15 0007 10/25/15 0729  GLUCAP 137* 148* 145*   Meds: D5-LR at 50 ml/hr, potassium chloride  Height:   Ht Readings from Last 1 Encounters:  10/22/15 5\' 4"  (1.626 m)    Weight:   Wt Readings from Last 1 Encounters:  10/25/15 151 lb 14.4 oz (68.9 kg)   Filed Weights   10/23/15 2200 10/24/15 0350 10/25/15 0546  Weight: 150 lb 2.1 oz (68.1 kg) 147 lb 7.8 oz (66.9 kg) 151 lb 14.4 oz (68.9 kg)    BMI:  Body mass index is 26.06 kg/(m^2).  Estimated Nutritional Needs:   Kcal:  1374 kcals (BEE 1165, Ve: 6.1, Tmax: 37.6) using wt of 67 kg  Protein:  101-134 g (1.5-2.0 g/kg)   Fluid:  1675-2010 mL (25-30 ml/kg)   HIGH Care Level  Kerman Passey MS, RD, LDN [phone] Pager

## 2015-10-25 NOTE — Progress Notes (Signed)
Pharmacy Consult for Electrolyte Management   Allergies  Allergen Reactions  . Shellfish Allergy     Reaction: unknown    Patient Measurements: Height: 5\' 4"  (162.6 cm) Weight: 151 lb 14.4 oz (68.9 kg) IBW/kg (Calculated) : 54.7  Vital Signs: Temp: 99.6 F (37.6 C) (10/31 0800) Temp Source: Oral (10/31 0800) BP: 147/129 mmHg (10/31 1200) Pulse Rate: 63 (10/31 1200) Intake/Output from previous day: 10/30 0701 - 10/31 0700 In: 1641.7 [I.V.:784.2; IV Piggyback:857.5] Out: 925 [Urine:925] Intake/Output from this shift: Total I/O In: 303 [I.V.:253; IV Piggyback:50] Out: 510 [Urine:510]  Labs:  Recent Labs  10/23/15 0151 10/23/15 0447 10/24/15 0503 10/25/15 0640  WBC  --  12.7* 9.3 10.4  HGB  --  12.5 11.5* 11.5*  HCT  --  37.6 34.2* 33.2*  PLT  --  188 193 218  INR 1.25  --   --   --      Recent Labs  10/22/15 1300 10/23/15 0151  10/23/15 1442 10/24/15 0503 10/24/15 1908 10/25/15 0640  NA 140  --   < >  --  139 140 141  K 3.9  --   < >  --  3.0* 3.2* 3.0*  CL 102  --   < >  --  106 107 108  CO2 26  --   < >  --  27 28 27   GLUCOSE 140*  --   < >  --  225* 164* 151*  BUN 37*  --   < >  --  31* 24* 19  CREATININE 1.20*  --   < >  --  0.78 0.76 0.74  CALCIUM 9.3  --   < >  --  8.7* 8.7* 8.7*  MG  --   --   --  1.9  --   --   --   PROT 6.8  --   --   --  5.9* 5.8*  --   ALBUMIN 2.8*  --   --   --  2.1* 2.0*  --   AST 28  --   --   --  93* 55*  --   ALT 14  --   --   --  57* 48  --   ALKPHOS 58  --   --   --  54 54  --   BILITOT 1.2  --   --   --  0.3 0.3  --   TRIG  --  97  --   --   --   --   --   < > = values in this interval not displayed. Estimated Creatinine Clearance: 60.6 mL/min (by C-G formula based on Cr of 0.74).    Recent Labs  10/24/15 2011 10/25/15 0007 10/25/15 0729  GLUCAP 137* 148* 145*    Medical History: Past Medical History  Diagnosis Date  . Dysuria   . Seizures ([place])   . Insomnia   . Wheelchair dependence   . Pressure  ulcer of foot, stage 2   . Urinary incontinence   . Coronary artery disease   . Stroke ([place])   . Hypertension   . Glaucoma     Medications:  Scheduled:  . antiseptic oral rinse  7 mL Mouth Rinse QID  . aspirin  300 mg Rectal Daily  . azithromycin  500 mg Intravenous Q24H  . brinzolamide  1 drop Both Eyes TID  . cefTRIAXone (ROCEPHIN)  IV  1 g Intravenous Q24H  . chlorhexidine  15 mL Mouth/Throat BID  . clopidogrel  75 mg Oral Daily  . clotrimazole  1 application Topical BID  . donepezil  5 mg Oral QHS  . enoxaparin (LOVENOX) injection  40 mg Subcutaneous Q24H  . levETIRAcetam  500 mg Intravenous Q12H  . metoprolol  2.5 mg Intravenous 3 times per day  . nitroGLYCERIN  0.5 inch Topical 3 times per day  . pantoprazole  40 mg Oral Daily  . potassium chloride  10 mEq Intravenous Q1 Hr x 4  . rosuvastatin  20 mg Oral QHS  . sodium chloride  3 mL Intravenous Q12H  . valproate sodium  250 mg Intravenous Q12H   Infusions:  . dextrose 5% lactated ringers with KCl 20 mEq/L 50 mL/hr at 10/24/15 1800    Assessment: Pharmacy consulted to assist in managing electrolytes in this 74 y/o F admitted with AMS and respiratory failure.    Plan:  Potassium is low at 3.0. Patient is receiving K via maintenance IVF, however at the current rate it is only providing 24 meq K daily. Will supplement with KCl 10 meq IV x 4 and recheck labs at 1800.   Ulice Dash D 10/25/2015,12:21 PM

## 2015-10-26 ENCOUNTER — Inpatient Hospital Stay: Payer: Medicare Other

## 2015-10-26 LAB — GLUCOSE, CAPILLARY
Glucose-Capillary: 109 mg/dL — ABNORMAL HIGH (ref 65–99)
Glucose-Capillary: 122 mg/dL — ABNORMAL HIGH (ref 65–99)
Glucose-Capillary: 157 mg/dL — ABNORMAL HIGH (ref 65–99)

## 2015-10-26 LAB — BASIC METABOLIC PANEL
Anion gap: 6 (ref 5–15)
BUN: 11 mg/dL (ref 6–20)
CHLORIDE: 106 mmol/L (ref 101–111)
CO2: 30 mmol/L (ref 22–32)
Calcium: 9 mg/dL (ref 8.9–10.3)
Creatinine, Ser: 0.73 mg/dL (ref 0.44–1.00)
GFR calc Af Amer: >60 mL/min (ref >60)
GFR calc non Af Amer: >60 mL/min (ref >60)
GLUCOSE: 137 mg/dL — AB (ref 65–99)
POTASSIUM: 3.4 mmol/L — AB (ref 3.5–5.1)
Sodium: 142 mmol/L (ref 135–145)

## 2015-10-26 LAB — PHOSPHORUS: Phosphorus: 2.2 mg/dL — ABNORMAL LOW (ref 2.5–4.6)

## 2015-10-26 LAB — MAGNESIUM: MAGNESIUM: 1.7 mg/dL (ref 1.7–2.4)

## 2015-10-26 MED ORDER — POTASSIUM PHOSPHATES 15 MMOLE/5ML IV SOLN
15.0000 mmol | Freq: Once | INTRAVENOUS | Status: AC
  Administered 2015-10-26: 15 mmol via INTRAVENOUS
  Filled 2015-10-26: qty 5

## 2015-10-26 MED ORDER — POTASSIUM CHLORIDE IN NACL 20-0.9 MEQ/L-% IV SOLN
INTRAVENOUS | Status: DC
  Administered 2015-10-26: 19:00:00 via INTRAVENOUS
  Filled 2015-10-26 (×4): qty 1000

## 2015-10-26 MED ORDER — MAGNESIUM SULFATE 2 GM/50ML IV SOLN
2.0000 g | Freq: Once | INTRAVENOUS | Status: AC
  Administered 2015-10-26: 2 g via INTRAVENOUS
  Filled 2015-10-26: qty 50

## 2015-10-26 MED ORDER — DIVALPROEX SODIUM 250 MG PO DR TAB
250.0000 mg | DELAYED_RELEASE_TABLET | Freq: Once | ORAL | Status: AC
  Administered 2015-10-26: 250 mg via ORAL
  Filled 2015-10-26: qty 1

## 2015-10-26 NOTE — Progress Notes (Signed)
Weaned pt off O2.  Pt is 95-97 % on RA at this time.  Clarise Cruz, RN

## 2015-10-26 NOTE — Evaluation (Signed)
Physical Therapy Evaluation Patient Details Name: Debra Hoffman MRN: [id number] DOB: [birth year] Today's Date: 10/26/2015   History of Present Illness  Patient is a 74 y/o female that presents with increasing weakness, R facial droop, and altered mental status. Patient noted to have demand ischemia, pleural effusions and atlectasis, and chronic L cereballar, basal ganglia bilaterally, and pons bilaterally.   Clinical Impression  Patient is unable to provide her mobility history, per case manager patient had been mostly bed bound for toileting and daily activities with dependence on family for mobility. No family present on evaluation to confirm this. Patient displays poor bed mobility and transfer skills as well as dependence with seated balance. If patient has been truly bed bound, this appears to be her baseline or close enough to it that she could potentially be managed by her family. If patient has been transferring to a w/c then she is significantly declined from her baseline and may benefit from short term rehabilitation to increase her sitting balance independence and bed mobility. PT will continue to follow and discuss with family for further recommendations. Skilled PT services are indicated to address her mobility deficits.     Follow Up Recommendations SNF;Home health PT    Equipment Recommendations       Recommendations for Other Services       Precautions / Restrictions Precautions Precautions: Fall Restrictions Weight Bearing Restrictions: No      Mobility  Bed Mobility Overal bed mobility: Needs Assistance Bed Mobility: Supine to Sit;Sit to Supine     Supine to sit: Max assist Sit to supine: Max assist   General bed mobility comments: Patient is able to bring her legs laterally though she requires total assist for trunk.   Transfers                 General transfer comment: Deferred secondary to poor seated balance.   Ambulation/Gait                Stairs            Wheelchair Mobility    Modified Rankin (Stroke Patients Only)       Balance Overall balance assessment: Needs assistance   Sitting balance-Leahy Scale: Zero Sitting balance - Comments: Patient cued significantly to bring her weight onto her L side, provided pillow as she pushes right (even in supine). Able to balance for 3-5 seconds independently then fatigued and required max A x1.  Postural control: Right lateral lean;Posterior lean                                   Pertinent Vitals/Pain Pain Assessment: No/denies pain    Home Living Family/patient expects to be discharged to:: Private residence Living Arrangements: Other relatives (Daughter and Geographical information systems officer) Available Help at Discharge: Family Type of Home: House                Prior Function           Comments: Discussed case with Case Manager, apparently patient has been bed bound for some time? No family present to describe toileting, ADLs, transfers.      Hand Dominance        Extremity/Trunk Assessment               Lower Extremity Assessment:  (Able to complete SLRs and knee flexion independently/minimal Assist)         Communication   Communication:  (  Very limited discussion, difficult to determine if this was based on ability or motivation)  Cognition Arousal/Alertness: Lethargic Behavior During Therapy: Flat affect Overall Cognitive Status: History of cognitive impairments - at baseline (Difficult to determine baseline cognition, it appears this may be her baseline or close to it.)                      General Comments      Exercises Other Exercises Other Exercises: 10 SLRs completed bilaterally, knee flexion/heels slides x 10 bilaterally  Other Exercises: Seated balance drills attempted with max A x1 to shift weight anteriorly and to her left.       Assessment/Plan    PT Assessment Patient needs continued PT services  PT  Diagnosis Generalized weakness   PT Problem List Decreased strength;Decreased balance;Decreased cognition  PT Treatment Interventions Therapeutic activities;Therapeutic exercise;Functional mobility training;Wheelchair mobility training;Balance training   PT Goals (Current goals can be found in the Care Plan section) Acute Rehab PT Goals Patient Stated Goal: Family would like patient to return home.  PT Goal Formulation: With family Time For Goal Achievement: 11/09/15 Potential to Achieve Goals: Fair    Frequency Min 2X/week   Barriers to discharge        Co-evaluation               End of Session Equipment Utilized During Treatment: Oxygen Activity Tolerance: Patient tolerated treatment well;Patient limited by lethargy Patient left: in bed;with bed alarm set;with call bell/phone within reach Nurse Communication: Mobility status         Time: 1655-3748 PT Time Calculation (min) (ACUTE ONLY): 23 min   Charges:   PT Evaluation $Initial PT Evaluation Tier I: 1 Procedure PT Treatments $Therapeutic Exercise: 8-22 mins   PT G Codes:       Kerman Passey, PT, DPT    10/26/2015, 1:10 PM

## 2015-10-26 NOTE — Plan of Care (Signed)
Problem: Discharge Progression Outcomes Goal: Other Discharge Outcomes/Goals Outcome: Progressing Plan of care progress to goals: 1. No s/s of pain, resting comfortably in bed 2. Hemodynamically:             -VSS, afebrile, IVF infusing as ordered, O2 stable on 2L [place]             -incomprehensible speech, tolerated meds crushed in applesauce w/ aspiration precautions used             -foley remains in place from CCU r/t critically ill  3. Currently unable to tolerate meals, lethargic & no interest 4. High fall risk. Bed alarm on, hourly rounding.

## 2015-10-26 NOTE — Care Management Important Message (Signed)
Important Message  Patient Details  Name: Debra Hoffman MRN: [id number] Date of Birth: 1941/08/13   Medicare Important Message Given:  Yes-second notification given    [name] 10/26/2015, 10:20 AM

## 2015-10-26 NOTE — Progress Notes (Signed)
PULMONARY / CRITICAL CARE MEDICINE   Name: Debra Hoffman MRN: [id number] DOB: 03-04-1941    ADMISSION DATE:  10/22/2015 CONSULTATION DATE:  10/23/15  Acute Respiratory failure due to pneumonia.  -Now doing better -Currently on azithromycin and ceftriaxone. Can continue to complete a 7 day course.    Delirium, Altered mental status.  -Thought to be secondary to metabolic encephalopathy, appears to be improving.  -Continue to advance activity as tolerated.   We'll sign off for now. Please call if there are any questions or concerns.  SUBJECTIVE:  Patient is resting comfortably. No new complaints today.  VITAL SIGNS: Temp:  [97.7 F (36.5 C)-98.8 F (37.1 C)] 97.7 F (36.5 C) (11/01 0500) Pulse Rate:  [58-65] 59 (11/01 1258) Resp:  [18-29] 18 (11/01 0500) BP: (142-170)/(67-89) 157/69 mmHg (11/01 1258) SpO2:  [92 %-100 %] 95 % (11/01 1258) Weight:  [71.487 kg (157 lb 9.6 oz)-71.986 kg (158 lb 11.2 oz)] 71.487 kg (157 lb 9.6 oz) (11/01 0500)   Intake/Output Summary (Last 24 hours) at 10/26/15 1311 Last data filed at 10/26/15 1038  Gross per 24 hour  Intake    650 ml  Output   3250 ml  Net  -2600 ml    PHYSICAL EXAMINATION: General: RASS -2, + F/C Neuro: PERRL, R facial droop, diffusely weak, RUE appears weaker than LUE HEENT: NCAT, WNL Cardiovascular: regular, no M noted Lungs: clear anteriorly Abdomen: Soft, NT, +BS Ext: warm, no edema  LABS:  CBC  Recent Labs Lab 10/23/15 0447 10/24/15 0503 10/25/15 0640  WBC 12.7* 9.3 10.4  HGB 12.5 11.5* 11.5*  HCT 37.6 34.2* 33.2*  PLT 188 193 218   Coag's  Recent Labs Lab 10/23/15 0151  INR 1.25   BMET  Recent Labs Lab 10/24/15 1908 10/25/15 0640 10/25/15 1822 10/26/15 0518  NA 140 141  --  142  K 3.2* 3.0* 3.7 3.4*  CL 107 108  --  106  CO2 28 27  --  30  BUN 24* 19  --  11  CREATININE 0.76 0.74  --  0.73  GLUCOSE 164* 151*  --  137*   Electrolytes  Recent Labs Lab 10/23/15 1442   10/24/15 1908 10/25/15 0640 10/25/15 1822 10/26/15 0518  CALCIUM  --   < > 8.7* 8.7*  --  9.0  MG 1.9  --   --   --  1.7 1.7  PHOS  --   --   --   --   --  2.2*  < > = values in this interval not displayed. Sepsis Markers  Recent Labs Lab 10/22/15 1300 10/22/15 1519 10/24/15 0503  LATICACIDVEN 1.7 2.0  --   PROCALCITON  --   --  0.32   ABG  Recent Labs Lab 10/22/15 1740 10/22/15 1958  PHART 7.52* 7.60*  PCO2ART 37 29*  PO2ART 106 133*   Liver Enzymes  Recent Labs Lab 10/22/15 1300 10/24/15 0503 10/24/15 1908  AST 28 93* 55*  ALT 14 57* 48  ALKPHOS 58 54 54  BILITOT 1.2 0.3 0.3  ALBUMIN 2.8* 2.1* 2.0*   Cardiac Enzymes  Recent Labs Lab 10/23/15 0151 10/23/15 0447 10/23/15 1442  TROPONINI 0.05* 0.07* 0.08*   Glucose  Recent Labs Lab 10/24/15 1744 10/24/15 2011 10/25/15 0007 10/25/15 0729 10/25/15 2341 10/26/15 1138  GLUCAP 154* 137* 148* 145* 98 122*   PCT - 0.32  CXR: RLL ATX    Deep Ashby Dawes, M.D.  PCCM service      10/26/2015, 1:11  PM

## 2015-10-26 NOTE — Progress Notes (Signed)
Pharmacy Consult for Electrolyte Management   Allergies  Allergen Reactions  . Shellfish Allergy     Reaction: unknown    Patient Measurements: Height: 5\' 4"  (162.6 cm) Weight: 157 lb 9.6 oz (71.487 kg) IBW/kg (Calculated) : 54.7  Vital Signs: Temp: 97.7 F (36.5 C) (11/01 0500) Temp Source: Oral (11/01 0500) BP: 150/71 mmHg (11/01 0500) Pulse Rate: 65 (11/01 0500) Intake/Output from previous day: 10/31 0701 - 11/01 0700 In: 1103 [I.V.:653; IV Piggyback:450] Out: 3060 [Urine:3060] Intake/Output from this shift:    Labs:  Recent Labs  10/24/15 0503 10/25/15 0640  WBC 9.3 10.4  HGB 11.5* 11.5*  HCT 34.2* 33.2*  PLT 193 218     Recent Labs  10/23/15 1442  10/24/15 0503 10/24/15 1908 10/25/15 0640 10/25/15 1822 10/26/15 0518  NA  --   < > 139 140 141  --  142  K  --   < > 3.0* 3.2* 3.0* 3.7 3.4*  CL  --   < > 106 107 108  --  106  CO2  --   < > 27 28 27   --  30  GLUCOSE  --   < > 225* 164* 151*  --  137*  BUN  --   < > 31* 24* 19  --  11  CREATININE  --   < > 0.78 0.76 0.74  --  0.73  CALCIUM  --   < > 8.7* 8.7* 8.7*  --  9.0  MG 1.9  --   --   --   --  1.7 1.7  PHOS  --   --   --   --   --   --  2.2*  PROT  --   --  5.9* 5.8*  --   --   --   ALBUMIN  --   --  2.1* 2.0*  --   --   --   AST  --   --  93* 55*  --   --   --   ALT  --   --  57* 48  --   --   --   ALKPHOS  --   --  54 54  --   --   --   BILITOT  --   --  0.3 0.3  --   --   --   < > = values in this interval not displayed. Estimated Creatinine Clearance: 61.6 mL/min (by C-G formula based on Cr of 0.73).    Recent Labs  10/25/15 0007 10/25/15 0729 10/25/15 2341  GLUCAP 148* 145* 98    Medical History: Past Medical History  Diagnosis Date  . Dysuria   . Seizures ([place])   . Insomnia   . Wheelchair dependence   . Pressure ulcer of foot, stage 2   . Urinary incontinence   . Coronary artery disease   . Stroke ([place])   . Hypertension   . Glaucoma     Medications:  Scheduled:   . antiseptic oral rinse  7 mL Mouth Rinse QID  . azithromycin  500 mg Oral Daily  . brinzolamide  1 drop Both Eyes TID  . cefTRIAXone (ROCEPHIN)  IV  1 g Intravenous Q24H  . chlorhexidine  15 mL Mouth/Throat BID  . cloNIDine  0.2 mg Transdermal Weekly  . clopidogrel  75 mg Oral Daily  . clotrimazole  1 application Topical BID  . divalproex  250 mg Oral Q12H  . donepezil  5 mg  Oral QHS  . enoxaparin (LOVENOX) injection  40 mg Subcutaneous Q24H  . levETIRAcetam  500 mg Oral BID  . metoprolol tartrate  12.5 mg Oral BID  . nitroGLYCERIN  1 inch Topical 3 times per day  . pantoprazole  40 mg Oral Daily  . potassium phosphate IVPB (mmol)  15 mmol Intravenous Once  . rosuvastatin  20 mg Oral QHS  . sodium chloride  3 mL Intravenous Q12H   Infusions:  . dextrose 5% lactated ringers with KCl 20 mEq/L 50 mL/hr at 10/26/15 0753    Assessment: Pharmacy consulted to assist in managing electrolytes in this 74 y/o F admitted with AMS and respiratory failure.   K: 3.4, Phos: 2.2  Plan:  Potassium is slightly low at 3.4.  Phosphorus is slightly low at 2.2. Patient is receiving K via maintenance IVF, however at the current rate it is only providing 24 meq K daily. Will supplement with Potassium Phosphate 15 mmol  IV x 1 (~20 meq of KCl).    Will recheck labs in AM. Pharmacy will continue to follow.  Georgiana Spillane G 10/26/2015,8:36 AM

## 2015-10-26 NOTE — Care Management (Signed)
Spoke with granddaughter, Raiford Noble. [phone])  This granddaughter is legal guardian and Ms. Hepburn lives with granddaughter since December 2015.  Copy of legal paper placed on chart.  Granddaughter has Psychologist, counselling every afternoon x 2 hours from [facility].  Granddaughter desires to take her grandmother home when discharged. Shelbie Ammons RN MSN CCM Care Management [phone]

## 2015-10-26 NOTE — Plan of Care (Signed)
Problem: Discharge Progression Outcomes Goal: Other Discharge Outcomes/Goals Outcome: Progressing Plan of care progress to goal for: 1. Pain-no c/o pain, pt resting comfortably in bed 2. Hemodynamically-             -VSS, afebrile, IVF infusing 3. Complications- 4. Diet-pt had good po intake  5. Activity-high falls, total care, bed alarm on and hourly rounding

## 2015-10-26 NOTE — Progress Notes (Signed)
[place] [facility] [place] NAME: Debra Hoffman    MR#:  [id number]  DATE OF BIRTH:  1941-05-18  SUBJECTIVE:  CHIEF COMPLAINT:   Chief Complaint  Patient presents with  . Altered Mental Status   patient is 74 year old African-American female who presents to the hospital with altered mental status, weakness and  Right facial droop. In ER, she was noted to be hypotensive, was initiated on BiPAP,  intubated because of hypoxemia  and inability to protect her airways. She was given IV fluids, antibiotics.  Her blood pressure has improved as well as her oxygenation. Extubated 10.30, remains poorly responsive, mumbling, but FC today, abel to converse, difficult to understand.    Review of Systems  Unable to perform ROS: mental acuity    VITAL SIGNS: Blood pressure 142/78, pulse 61, temperature 97.7 F (36.5 C), temperature source Oral, resp. rate 18, height 5\' 4"  (1.626 m), weight 71.487 kg (157 lb 9.6 oz), SpO2 100 %.  PHYSICAL EXAMINATION:   GENERAL:  74 y.o.-year-old patient lying in the bed with no acute distress, responsive to verbal stimuli, mumbles, converses, keeps eyes closed . Still right facial droop, although patient is leaning her head towards the right EYES: Pupils equal, round, reactive to light and accommodation. No scleral icterus. Extraocular muscles intact.  HEENT: Head atraumatic, normocephalic. Oropharynx and nasopharynx clear. Edentulous .  NECK:  Supple, no jugular venous distention. No thyroid enlargement, no tenderness.  LUNGS: Diminished breath sounds bilaterally at bases, no wheezing, rales,rhonchi or crepitation. No use of accessory muscles of respiration. Poor effort CARDIOVASCULAR: S1, S2 normal. No murmurs, rubs, or gallops.  ABDOMEN: Soft, nontender, nondistended. Bowel sounds present. No organomegaly or mass.  EXTREMITIES: No pedal edema, cyanosis, or clubbing.  NEUROLOGIC: Cranial nerves not able to evaluate,   some right facial droop, but the head is being dictated to the right. Muscle strength not able to evaluate, although able to move all extremities to painful stimuli. Sensation not able to evaluate. Gait not checked.  PSYCHIATRIC: The patient is some somnolent, does not  open her eyes . Converses, , intermittently following commands. Mumbles her name , answers some questions SKIN: No obvious rash, lesion, or ulcer.   ORDERS/RESULTS REVIEWED:   CBC  Recent Labs Lab 10/22/15 1300 10/23/15 0447 10/24/15 0503 10/25/15 0640  WBC 11.1* 12.7* 9.3 10.4  HGB 13.5 12.5 11.5* 11.5*  HCT 39.3 37.6 34.2* 33.2*  PLT 195 188 193 218  MCV 91.4 92.8 91.9 91.7  MCH 31.3 30.8 30.8 31.7  MCHC 34.2 33.2 33.5 34.6  RDW 14.4 14.4 14.7* 14.8*  LYMPHSABS 0.9*  --   --   --   MONOABS 1.2*  --   --   --   EOSABS 0.0  --   --   --   BASOSABS 0.1  --   --   --    ------------------------------------------------------------------------------------------------------------------  Chemistries   Recent Labs Lab 10/22/15 1300 10/23/15 0447 10/23/15 1442 10/24/15 0503 10/24/15 1908 10/25/15 0640 10/25/15 1822 10/26/15 0518  NA 140 142  --  139 140 141  --  142  K 3.9 3.0*  --  3.0* 3.2* 3.0* 3.7 3.4*  CL 102 103  --  106 107 108  --  106  CO2 26 26  --  27 28 27   --  30  GLUCOSE 140* 96  --  225* 164* 151*  --  137*  BUN 37* 33*  --  31* 24*  19  --  11  CREATININE 1.20* 0.92  --  0.78 0.76 0.74  --  0.73  CALCIUM 9.3 9.0  --  8.7* 8.7* 8.7*  --  9.0  MG  --   --  1.9  --   --   --  1.7 1.7  AST 28  --   --  93* 55*  --   --   --   ALT 14  --   --  57* 48  --   --   --   ALKPHOS 58  --   --  54 54  --   --   --   BILITOT 1.2  --   --  0.3 0.3  --   --   --    ------------------------------------------------------------------------------------------------------------------ estimated creatinine clearance is 61.6 mL/min (by C-G formula based on Cr of  0.73). ------------------------------------------------------------------------------------------------------------------ No results for input(s): TSH, T4TOTAL, T3FREE, THYROIDAB in the last 72 hours.  Invalid input(s): FREET3  Cardiac Enzymes  Recent Labs Lab 10/23/15 0151 10/23/15 0447 10/23/15 1442  TROPONINI 0.05* 0.07* 0.08*   ------------------------------------------------------------------------------------------------------------------ Invalid input(s): POCBNP ---------------------------------------------------------------------------------------------------------------  RADIOLOGY: Ct Head Wo Contrast  10/25/2015  CLINICAL DATA:  Altered mental status. EXAM: CT HEAD WITHOUT CONTRAST TECHNIQUE: Contiguous axial images were obtained from the base of the skull through the vertex without intravenous contrast. COMPARISON:  CT scan of October 22, 2015. FINDINGS: Right maxillary and bilateral ethmoid sinusitis is noted. Mild diffuse cortical atrophy is again noted. Mild chronic ischemic white 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 No mass effect or midline shift is noted. Ventricular size is within normal limits. There is no evidence of mass lesion, acute hemorrhage or acute infarction. IMPRESSION: Bilateral ethmoid and right maxillary sinusitis. Mild diffuse cortical atrophy. Mild chronic ischemic white matter disease. 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 No significant change compared to prior exam. Electronically Signed   By: Marijo Conception, M.D.   On: 10/25/2015 12:21   Dg Chest Port 1 View  10/25/2015  CLINICAL DATA:  Respiratory failure. EXAM: PORTABLE CHEST 1 VIEW COMPARISON:  10/24/2015. FINDINGS: Mediastinum hilar structures normal. Prior median sternotomy and CABG. Cardiomegaly with persistent bibasilar atelectasis and/or infiltrates. Small pleural effusions cannot be excluded. No pneumothorax. No acute osseous  abnormality . IMPRESSION: 1. Persistent atelectasis and or infiltrates in the lower lobes. Small pleural effusions cannot be excluded. 2. Prior CABG .  Stable cardiomegaly. Electronically Signed   By: Marcello Moores  Register   On: 10/25/2015 07:15    EKG:  Orders placed or performed during the hospital encounter of 10/22/15  . ED EKG  . ED EKG  . EKG 12-Lead  . EKG 12-Lead    ASSESSMENT AND PLAN:  Active Problems:   Pneumonia   Elevated troponin 1. Acute respiratory failure with hypoxia due to multifocal pneumonia, now extubated, weaned off oxygen keeping pulse oximetry at around 94%. Getting palliative care involved today to discuss case with family and follow along. Repeated CT of the head did not reveal any new stroke, but now since patient is extubated,  we are going to get a better look with MRI of the brain.  2. Multifocal pneumonia. Continue patient Rocephin and Zithromax. Awaiting for sputum cultures.  Speech therapist involved whenever able. Most recent chest x-ray revealed worsening right base atelectasis, consolidation, but white blood cell count remained stable and patient remained afebrile. Her oxygenation was also stable. Procalcitonin wasn't mildly elevated.  3. Pyuria, sterile ,  no urinary tract infection.   4. Acute encephalopathy, suspected multifactorial due to metabolic derangements including hypoxia, infection, and questionable CVA, although CT scan of head showed no new abnormalities . Getting brain MRI and awaiting for palliative care intervention and  discussion with family 5. Renal insufficiency. Resolved on IV fluids. Following kidney function closely 6. Diabetes mellitus. Continue accucheks, blood glucose levels are between 100s to 120s, now on d5w  IV drip while nothing by mouth 7. Leukocytosis. Resolved with antibiotic therapy 8. Elevated troponin, likely due to hypoxia demand ischemia. Stable troponins 3, continue patient on aspirin ,  Nitroglycerin topically and  metoprolol orally. Further evaluation and intervention depending on her neurologic status, discussions with palliative care 9 sepsis due to pneumonia, symptoms resolved, blood cx not done, continue current antibiotics 10 h/o seizures, now on keppra and valproate orally, will discontinue Keppra after advancement of valproic acid, no seizure activity noted  11. Generalized weakness. Physical therapist consultation is requested  Management plans discussed with the patient, family and they are in agreement.  Likely discharge tomorrow DRUG ALLERGIES:  Allergies  Allergen Reactions  . Shellfish Allergy     Reaction: unknown    CODE STATUS:     Code Status Orders        Start     Ordered   10/22/15 2157  Full code   Continuous     10/22/15 2156      TOTAL TIME TAKING CARE OF THIS PATIENT: 35 minutes.    Theodoro Grist M.D on 10/26/2015 at 9:54 AM  Between 7am to 6pm - Pager - [phone]  After 6pm go to [email or web address] - password [email or web address] [facility]  [facility]  Office  [phone]  CC: Primary care physician; No primary care provider on file.

## 2015-10-26 NOTE — Evaluation (Signed)
Clinical/Bedside Swallow Evaluation Patient Details  Name: Debra Hoffman MRN: [id number] Date of Birth: [birth year]  Today's Date: 10/26/2015 Time: SLP Start Time (ACUTE ONLY): 0930 SLP Stop Time (ACUTE ONLY): 1030 SLP Time Calculation (min) (ACUTE ONLY): 60 min  Past Medical History:  Past Medical History  Diagnosis Date  . Dysuria   . Seizures ([place])   . Insomnia   . Wheelchair dependence   . Pressure ulcer of foot, stage 2   . Urinary incontinence   . Coronary artery disease   . Stroke ([place])   . Hypertension   . Glaucoma    Past Surgical History:  Past Surgical History  Procedure Laterality Date  . Cardiac surgery    . Coronary artery bypass graft     HPI:  Pt is a 74 y.o. female with a known history of diabetes, Dementia, seizure disorder, hyperlipidemia, hypertension, gout, stroke, which left her with left-sided weakness and dysphagia presents from home with altered mental status, dry cough, increasing weakness and right facial droop. Apparently she was doing well up until a week at all so ago when she started having problems with poor appetite. She would not eat and drink of fluids. She was seen by her primary care physician and sent home. Today, however, she was noted to have altered mental status. She was hypotensive at home with systolic blood pressure in 90s and febrile with temperature 101.6 rectally, per patient's granddaughter [place]. She was coughing and had increased weakness, right facial droop was noted by family. She was brought to emergency room where she was noted to be hypoxic and confused. She was initiated on BiPAP with improvement of her oxygenation, however, remained tachypneic with respiration rate of close to 30s despite BiPAP. Labs revealed renal insufficiency with right now 1.2, hyperglycemia, elevated troponin and elevated white blood cell count. Urinalysis revealed pyuria. Chest x-ray revealed right infrahilar density concerning for mass versus pneumonia.  CT of head showed atrophy, chronic subdural hygromas. CT chest showed multifocal pneumonia. Granddaugher stated she was preparing foods for pt; soft foods, but denied any overt s/s of aspiration w/ po's. In the past 1-2 days, she stated pt was fed by staff when she was less alert and this concerned her. Currently, pt is able to awaken w/ min-mod stim. She responded to tactile cues of spoon to lips and mumbled frequently about the ice cream and water.    Assessment / Plan / Recommendation Clinical Impression  Pt appeared to present w/ adequate toleration of thin liquids, purees, and ice chips w/ no immediate, overt s/s of aspiration noted; pt did exhibit audible swallows during drinking of liquids and verbal/tactile cues were given to increased awareness for bolus acceptance w/ all trials. Pt required pinching of straw to limit consecutive bolus drinking - suspect d/t her baseline Cognitive status(Dementia). During the oral phase, pt cleared appropriately given time. Rec. a Dys. 1 w/ thin liquids at this time w/ strict aspiration precautions including monitoring drinking of thin liquids. Rec. Meds in Puree - Crushed as nec./able. Pt may be close to her baseline; family stated it was easy to prepare "softer" foods for her at home.     Aspiration Risk   (reduced following aspiration precautions)    Diet Recommendation Dysphagia 1 (Puree);Thin   Medication Administration: Crushed with puree (as able) Compensations: Minimize environmental distractions;Slow rate;Small sips/bites    Other  Recommendations Recommended Consults:  (dietician consult) Oral Care Recommendations: Oral care BID;Staff/trained caregiver to provide oral care   Follow Up  Recommendations       Frequency and Duration min 3x week  1 week   Pertinent Vitals/Pain denied    SLP Swallow Goals  see care plan   Swallow Study Prior Functional Status   lived at home w/ full care for ADLs. Pt is eating a "soft foods" diet at home  and requires assistance per family.    General Date of Onset: 10/22/15 Other Pertinent Information: Pt is a 74 y.o. female with a known history of diabetes, Dementia, seizure disorder, hyperlipidemia, hypertension, gout, stroke, which left her with left-sided weakness and dysphagia presents from home with altered mental status, dry cough, increasing weakness and right facial droop. Apparently she was doing well up until a week at all so ago when she started having problems with poor appetite. She would not eat and drink of fluids. She was seen by her primary care physician and sent home. Today, however, she was noted to have altered mental status. She was hypotensive at home with systolic blood pressure in 90s and febrile with temperature 101.6 rectally, per patient's granddaughter [place]. She was coughing and had increased weakness, right facial droop was noted by family. She was brought to emergency room where she was noted to be hypoxic and confused. She was initiated on BiPAP with improvement of her oxygenation, however, remained tachypneic with respiration rate of close to 30s despite BiPAP. Labs revealed renal insufficiency with right now 1.2, hyperglycemia, elevated troponin and elevated white blood cell count. Urinalysis revealed pyuria. Chest x-ray revealed right infrahilar density concerning for mass versus pneumonia. CT of head showed atrophy, chronic subdural hygromas. CT chest showed multifocal pneumonia. Granddaugher stated she was preparing foods for pt; soft foods, but denied any overt s/s of aspiration w/ po's. In the past 1-2 days, she stated pt was fed by staff when she was less alert and this concerned her. Currently, pt is able to awaken w/ min-mod stim. She responded to tactile cues of spoon to lips and mumbled frequently about the ice cream and water.  Type of Study: Bedside swallow evaluation Previous Swallow Assessment: none indicated by family Diet Prior to this Study: Dysphagia 3  (soft);Thin liquids Temperature Spikes Noted: No (wbc 10.4) Respiratory Status: Room air History of Recent Intubation: No Behavior/Cognition: Cooperative;Confused;Distractible;Requires cueing (drowsy; eyes remained closed(Cognitive?)) Oral Cavity - Dentition: Missing dentition Self-Feeding Abilities: Total assist Patient Positioning: Upright in bed Baseline Vocal Quality: Low vocal intensity (mumbled speech ) Volitional Cough: Cognitively unable to elicit Volitional Swallow: Unable to elicit    Oral/Motor/Sensory Function Overall Oral Motor/Sensory Function:  (unable to formally assess sec. to declined Cognitive status)   Ice Chips Ice chips: Within functional limits Presentation: Spoon (4 trials)   Thin Liquid Thin Liquid: Impaired (grossly) Presentation: Straw (fed; 10 trials) Oral Phase Impairments: Poor awareness of bolus (required verbal/tactile cues to follow/accept po trials) Oral Phase Functional Implications:  (none) Pharyngeal  Phase Impairments:  (audible swallows) Other Comments: pinched straw to limit consecutive boluses    Nectar Thick Nectar Thick Liquid: Not tested   Honey Thick Honey Thick Liquid: Not tested   Puree Puree: Within functional limits Presentation: Spoon (fed; ~3 ozs) Other Comments: verbal/tactile cues given to increased awareness for bolus acceptance   Solid   GO    Solid: Not tested      Orinda Kenner, MS, CCC-SLP  Watson,Katherine 10/26/2015,11:10 AM

## 2015-10-27 DIAGNOSIS — N289 Disorder of kidney and ureter, unspecified: Secondary | ICD-10-CM

## 2015-10-27 DIAGNOSIS — R1310 Dysphagia, unspecified: Secondary | ICD-10-CM

## 2015-10-27 DIAGNOSIS — G9340 Encephalopathy, unspecified: Secondary | ICD-10-CM

## 2015-10-27 DIAGNOSIS — R531 Weakness: Secondary | ICD-10-CM

## 2015-10-27 DIAGNOSIS — R8281 Pyuria: Secondary | ICD-10-CM

## 2015-10-27 DIAGNOSIS — J9601 Acute respiratory failure with hypoxia: Secondary | ICD-10-CM

## 2015-10-27 DIAGNOSIS — D72829 Elevated white blood cell count, unspecified: Secondary | ICD-10-CM

## 2015-10-27 DIAGNOSIS — E119 Type 2 diabetes mellitus without complications: Secondary | ICD-10-CM

## 2015-10-27 LAB — BASIC METABOLIC PANEL
ANION GAP: 5 (ref 5–15)
BUN: 9 mg/dL (ref 6–20)
CHLORIDE: 108 mmol/L (ref 101–111)
CO2: 30 mmol/L (ref 22–32)
Calcium: 8.9 mg/dL (ref 8.9–10.3)
Creatinine, Ser: 0.75 mg/dL (ref 0.44–1.00)
GFR calc non Af Amer: >60 mL/min (ref >60)
GLUCOSE: 131 mg/dL — AB (ref 65–99)
Potassium: 3.3 mmol/L — ABNORMAL LOW (ref 3.5–5.1)
Sodium: 143 mmol/L (ref 135–145)

## 2015-10-27 LAB — GLUCOSE, CAPILLARY
GLUCOSE-CAPILLARY: 92 mg/dL (ref 65–99)
Glucose-Capillary: 114 mg/dL — ABNORMAL HIGH (ref 65–99)
Glucose-Capillary: 154 mg/dL — ABNORMAL HIGH (ref 65–99)

## 2015-10-27 LAB — HEMOGLOBIN A1C: HEMOGLOBIN A1C: 5.4 % (ref 4.0–6.0)

## 2015-10-27 LAB — MAGNESIUM: Magnesium: 1.9 mg/dL (ref 1.7–2.4)

## 2015-10-27 LAB — PHOSPHORUS: PHOSPHORUS: 2.5 mg/dL (ref 2.5–4.6)

## 2015-10-27 LAB — VALPROIC ACID LEVEL: VALPROIC ACID LVL: 39 ug/mL — AB (ref 50.0–100.0)

## 2015-10-27 MED ORDER — METOPROLOL TARTRATE 25 MG PO TABS: 12.5000 mg | ORAL_TABLET | Freq: Two times a day (BID) | ORAL | Status: DC

## 2015-10-27 MED ORDER — ISOSORBIDE MONONITRATE ER 30 MG PO TB24: 30.0000 mg | ORAL_TABLET | Freq: Every day | ORAL | Status: DC

## 2015-10-27 MED ORDER — DIVALPROEX SODIUM 250 MG PO DR TAB
500.0000 mg | DELAYED_RELEASE_TABLET | Freq: Two times a day (BID) | ORAL | Status: DC
  Administered 2015-10-27: 500 mg via ORAL
  Filled 2015-10-27: qty 2

## 2015-10-27 MED ORDER — AZITHROMYCIN 250 MG PO TABS: 250.0000 mg | ORAL_TABLET | Freq: Every day | ORAL | Status: DC

## 2015-10-27 MED ORDER — POTASSIUM CHLORIDE 10 MEQ/100ML IV SOLN
10.0000 meq | INTRAVENOUS | Status: AC
  Administered 2015-10-27 (×4): 10 meq via INTRAVENOUS
  Filled 2015-10-27 (×4): qty 100

## 2015-10-27 NOTE — Plan of Care (Signed)
Problem: Discharge Progression Outcomes Goal: Other Discharge Outcomes/Goals Outcome: Progressing Plan of care progress to goals: Discharge plan: pt is from home with grand daughter and would like to return home at time of d/c. Pain control: No c/o pain this shift Hemodynamically stable: afebrile this shift, VSS, continue to monitor labs Tolerating diet: tolerating, no c/o nausea or vomiting. Activity appropriate for d/c: pt is mostly bedbound with only stand and pivot OOB per report, pt appears to be at baseline.

## 2015-10-27 NOTE — Care Management (Signed)
Discussed Home Health vs Skilled Nursing with granddaughter this morning. Would like to take her grandmother home with [facility]. Dr. Ether Griffins updated. Will discharge to home today with services in place. Granddaughter will transport. Will fax information to [facility].  Shelbie Ammons RN MSN CCM Care Management [phone]

## 2015-10-27 NOTE — Discharge Summary (Addendum)
[facility] [place] NAME: Debra Hoffman    MR#:  [id number]  DATE OF BIRTH:  15-Sep-1941  DATE OF ADMISSION:  10/22/2015 ADMITTING PHYSICIAN: Theodoro Grist, MD  DATE OF DISCHARGE: No discharge date for patient encounter.  PRIMARY CARE PHYSICIAN: No primary care provider on file.     ADMISSION DIAGNOSIS:  Community acquired pneumonia [J18.9] Elevated troponin [R79.89] Abnormal chest x-ray [R93.8] Altered mental status, unspecified altered mental status type [R41.82]  DISCHARGE DIAGNOSIS:  Active Problems:   Pneumonia   Acute respiratory failure with hypoxia (HCC)   Dysphagia   Acute encephalopathy   Elevated troponin   Renal insufficiency   Sterile pyuria   Diabetes mellitus (HCC)   Leukocytosis   Generalized weakness   SECONDARY DIAGNOSIS:   Past Medical History  Diagnosis Date  . Dysuria   . Seizures ([place])   . Insomnia   . Wheelchair dependence   . Pressure ulcer of foot, stage 2   . Urinary incontinence   . Coronary artery disease   . Stroke ([place])   . Hypertension   . Glaucoma     .pro HOSPITAL COURSE:   The patient is 74 year old African-American female who presented to the hospital with altered mental status, weakness and Right facial droop. In ER, she was noted to be hypotensive, was initiated on BiPAP. Her chest x-ray revealedright infrahilar density. CT scan of the chest showed multifocal pneumonia with 1.3 cm precarinal lymph node which was felt to be reactive . 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 CT of the head showed no abnormalities, but atrophy and chronic subdural hygromas, repeated CT scan of the head also was similar. 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 Patient was managed initially on BiPAP, however, required brief  intubation because of hypoxemia and inability to protect her airways. She was given IV fluids, antibiotics. Her blood pressure has improved as well as her oxygenation. Extubated 10.30. 16, mental status improved , patient was seen by physical therapist and recommended home health physical therapy, RN and aide. Family was agreeable Discussion by problem 1. Acute respiratory failure with hypoxia due to multifocal pneumonia, now extubated, weaned off oxygen,  pulse oximetry remains good at above 94%. Stable to be discharged home. Continue antibiotic therapy to complete course  2. Multifocal pneumonia. The patient received intravenous Rocephin and Zithromax for 6 days, she is to continue antibiotic orally with Zithromax to complete course. sputum cultures are pending. Speech therapist evaluate patient and recommended dysphagia 1 diet with thin liquids. Still possible that patient has aspiration pneumonitis 3. Pyuria, sterile , no urinary tract infection.  4. Acute encephalopathy, suspected multifactorial due to metabolic derangements including hypoxia, infection, , but not acute CVA, as to CT scans of head as well as MRI of brain showed no new abnormalities . Patient would benefit from a palliative care follow up as outpatient 5. Renal insufficiency. Resolved on IV fluids. Following kidney function closely as outpatient 6. Diabetes mellitus. Continue outpatient management  7. Leukocytosis. Resolved with antibiotic therapy 8. Elevated troponin, likely due to hypoxia/ demand ischemia. Stable troponins 3, continue patient on aspirin  and metoprolol orally. Follow-up with primary care physician for recommendations. Echocardiogram was normal 9 sepsis due to pneumonia, symptoms resolved, blood cx not done, continue Zithromax to complete  course 10 h/o seizures, discontinue keppra and advance valproate orally, follow valproic acid level closely as outpatient  11. Generalized weakness. Physical therapist  recommended home health, to be arranged for patient upon discharge  DISCHARGE CONDITIONS:   Stable  CONSULTS OBTAINED:  Treatment Team:  Evans Lance, MD  DRUG ALLERGIES:   Allergies  Allergen Reactions  . Shellfish Allergy     Reaction: unknown    DISCHARGE MEDICATIONS:   Current Discharge Medication List    START taking these medications   Details  azithromycin (ZITHROMAX) 250 MG tablet Take 1 tablet (250 mg total) by mouth daily. Qty: 4 each, Refills: 0    isosorbide mononitrate (IMDUR) 30 MG 24 hr tablet Take 1 tablet (30 mg total) by mouth daily. Qty: 30 tablet, Refills: 6    metoprolol tartrate (LOPRESSOR) 25 MG tablet Take 0.5 tablets (12.5 mg total) by mouth 2 (two) times daily. Qty: 60 tablet, Refills: 6      CONTINUE these medications which have NOT CHANGED   Details  brinzolamide (AZOPT) 1 % ophthalmic suspension Place 1 drop into both eyes 3 (three) times daily.    carvedilol (COREG) 12.5 MG tablet Take 12.5 mg by mouth 2 (two) times daily.    clopidogrel (PLAVIX) 75 MG tablet Take 75 mg by mouth daily.    clotrimazole (LOTRIMIN) 1 % cream Apply 1 application topically 2 (two) times daily. For 7-14 days    divalproex (DEPAKOTE) 250 MG DR tablet Take 250 mg by mouth 2 (two) times daily.    docusate sodium (COLACE) 100 MG capsule Take 100 mg by mouth daily.    donepezil (ARICEPT) 5 MG tablet Take 5 mg by mouth at bedtime.    esomeprazole (NEXIUM) 20 MG capsule Take 20 mg by mouth daily at 12 noon.    hydrochlorothiazide (MICROZIDE) 12.5 MG capsule Take 12.5 mg by mouth daily.    metFORMIN (GLUCOPHAGE) 500 MG tablet Take 500 mg by mouth daily.    nitroGLYCERIN (NITROSTAT) 0.4 MG SL tablet Place 0.4 mg under the tongue every 5 (five) minutes as needed for chest pain.    polyethylene glycol (MIRALAX / GLYCOLAX) packet Take 17 g by mouth daily as needed for mild constipation, moderate constipation or severe constipation.    potassium citrate  (UROCIT-K) 10 MEQ (1080 MG) SR tablet Take 10 mEq by mouth daily.    psyllium (METAMUCIL SMOOTH TEXTURE) 28 % packet Take 1 packet by mouth See admin instructions. One to two times a day as needed for constipation    rosuvastatin (CRESTOR) 20 MG tablet Take 20 mg by mouth at bedtime.    solifenacin (VESICARE) 10 MG tablet Take 10 mg by mouth daily.    traZODone (DESYREL) 150 MG tablet Take 150 mg by mouth at bedtime.      STOP taking these medications     levETIRAcetam (KEPPRA) 500 MG tablet      naproxen (NAPROSYN) 250 MG tablet          DISCHARGE INSTRUCTIONS:    Patient is to follow-up with primary care physician as outpatient  If you experience worsening of your admission symptoms, develop shortness of breath, life threatening emergency, suicidal or homicidal thoughts you must seek medical attention immediately by calling 911 or calling your MD immediately  if symptoms less severe.  You Must read complete instructions/literature along with all the possible adverse reactions/side effects for all the Medicines you take and that have been prescribed to you. Take any new Medicines after  you have completely understood and accept all the possible adverse reactions/side effects.   Please note  You were cared for by a hospitalist during your hospital stay. If you have any questions about your discharge medications or the care you received while you were in the hospital after you are discharged, you can call the unit and asked to speak with the hospitalist on call if the hospitalist that took care of you is not available. Once you are discharged, your primary care physician will handle any further medical issues. Please note that NO REFILLS for any discharge medications will be authorized once you are discharged, as it is imperative that you return to your primary care physician (or establish a relationship with a primary care physician if you do not have one) for your aftercare needs so  that they can reassess your need for medications and monitor your lab values.    Today   CHIEF COMPLAINT:   Chief Complaint  Patient presents with  . Altered Mental Status    HISTORY OF PRESENT ILLNESS:  Debra Hoffman  is a 74 y.o. female with a known history of diabetes, dementia, seizure disorder, hyperlipidemia, hypertension, gout, stroke with left-sided weakness, history of dysphagia, who presented to the hospital with altered mental status, weakness and Right facial droop. In ER, she was noted to be hypotensive, was initiated on BiPAP. Her chest x-ray revealedright infrahilar density. CT scan of the chest showed multifocal pneumonia with 1.3 cm precarinal lymph node which was felt to be reactive . 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 CT of the head showed no abnormalities, but atrophy and chronic subdural hygromas, repeated CT scan of the head also was similar. 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 Patient was managed initially on BiPAP, however, required brief intubation because of hypoxemia and inability to protect her airways. She was given IV fluids, antibiotics. Her blood pressure has improved as well as her oxygenation. Extubated 10.30. 16, mental status improved , patient was seen by physical therapist and recommended home health physical therapy, RN and aide. Family was agreeable Discussion by problem 1. Acute respiratory failure with hypoxia due to multifocal pneumonia, now extubated, weaned off oxygen,  pulse oximetry remains good at above 94%. Stable to be discharged home. Continue antibiotic therapy to complete course  2. Multifocal pneumonia. The patient received intravenous Rocephin and Zithromax for 6 days, she is to continue antibiotic orally with Zithromax to complete course. sputum cultures are  pending. Speech therapist evaluate patient and recommended dysphagia 1 diet with thin liquids. Still possible that patient has aspiration pneumonitis 3. Pyuria, sterile , no urinary tract infection.  4. Acute encephalopathy secondary to sepsis, but overall multifactorial due to metabolic derangements including hypoxia, infection, , but not an acute CVA, as to CT scans of head as well as MRI of brain showed no new abnormalities . Patient would benefit from a palliative care follow up as outpatient 5. Renal insufficiency. Resolved on IV fluids. Following kidney function closely as outpatient 6. Diabetes mellitus. Continue outpatient management  7. Leukocytosis. Resolved with antibiotic therapy 8. Elevated troponin, likely due to hypoxia/ demand ischemia. Stable troponins 3, continue patient on aspirin  and metoprolol orally. Follow-up with primary care physician for recommendations. Echocardiogram was normal 9 sepsis due to pneumonia, symptoms resolved, blood cx not done,  continue Zithromax to complete course 10 h/o seizures, discontinue keppra and advance valproate orally, follow valproic acid level closely as outpatient  11. Generalized weakness. Physical therapist recommended home health, to be arranged for patient upon discharge     VITAL SIGNS:  Blood pressure 145/66, pulse 71, temperature 98.3 F (36.8 C), temperature source Axillary, resp. rate 20, height 5\' 4"  (1.626 m), weight 71.804 kg (158 lb 4.8 oz), SpO2 96 %.  I/O:   Intake/Output Summary (Last 24 hours) at 10/27/15 1437 Last data filed at 10/27/15 0900  Gross per 24 hour  Intake    975 ml  Output      0 ml  Net    975 ml    PHYSICAL EXAMINATION:  GENERAL:  74 y.o.-year-old patient lying in the bed with no acute distress.  EYES: Pupils equal, round, reactive to light and accommodation. No scleral icterus. Extraocular muscles intact.  HEENT: Head atraumatic, normocephalic. Oropharynx and nasopharynx clear.  NECK:   Supple, no jugular venous distention. No thyroid enlargement, no tenderness.  LUNGS: Normal breath sounds bilaterally, no wheezing, rales,rhonchi or crepitation. No use of accessory muscles of respiration.  CARDIOVASCULAR: S1, S2 normal. No murmurs, rubs, or gallops.  ABDOMEN: Soft, non-tender, non-distended. Bowel sounds present. No organomegaly or mass.  EXTREMITIES: No pedal edema, cyanosis, or clubbing.  NEUROLOGIC: Cranial nerves II through XII are intact. Muscle strength 5/5 in all extremities. Sensation intact. Gait not checked.  PSYCHIATRIC: The patient is alert and oriented x 3.  SKIN: No obvious rash, lesion, or ulcer.   DATA REVIEW:   CBC  Recent Labs Lab 10/25/15 0640  WBC 10.4  HGB 11.5*  HCT 33.2*  PLT 218    Chemistries   Recent Labs Lab 10/24/15 1908  10/27/15 0517  NA 140  < > 143  K 3.2*  < > 3.3*  CL 107  < > 108  CO2 28  < > 30  GLUCOSE 164*  < > 131*  BUN 24*  < > 9  CREATININE 0.76  < > 0.75  CALCIUM 8.7*  < > 8.9  MG  --   < > 1.9  AST 55*  --   --   ALT 48  --   --   ALKPHOS 54  --   --   BILITOT 0.3  --   --   < > = values in this interval not displayed.  Cardiac Enzymes  Recent Labs Lab 10/23/15 1442  TROPONINI 0.08*    Microbiology Results  Results for orders placed or performed during the hospital encounter of 10/22/15  Urine culture     Status: None   Collection Time: 10/22/15  2:55 PM  Result Value Ref Range Status   Specimen Description URINE, RANDOM  Final   Special Requests Normal  Final   Culture NO GROWTH 2 DAYS  Final   Report Status 10/24/2015 FINAL  Final  MRSA PCR Screening     Status: None   Collection Time: 10/22/15  9:47 PM  Result Value Ref Range Status   MRSA by PCR NEGATIVE NEGATIVE Final    Comment:        The GeneXpert MRSA Assay (FDA approved for NASAL specimens only), is one component of a comprehensive MRSA colonization surveillance program. It is not intended to diagnose MRSA infection nor to  guide or monitor treatment for MRSA infections.     RADIOLOGY:  Mr Herby Abraham Contrast  10/26/2015  CLINICAL DATA:  Cerebral infarction. Right  facial droop. Left-sided weakness. Dysphagia. Fever. EXAM: MRI HEAD WITHOUT CONTRAST TECHNIQUE: Multiplanar, multiecho pulse sequences of the brain and surrounding structures were obtained without intravenous contrast. COMPARISON:  CT head 10/25/2015 FINDINGS: Moderate to large bilateral subdural hygromas are unchanged. These have CSF signal intensity, without evidence of blood products. These measure approximately 11 mm in thickness on the right and 10 mm on the left. No shift of the midline structures. Generalized atrophy.  Negative for hydrocephalus. Chronic microvascular ischemic change in the white matter. Chronic infarcts in the basal ganglia bilaterally. Chronic infarcts in the pons bilaterally. Chronic infarct left inferior cerebellum. Sinusitis with diffuse mucosal thickening throughout the paranasal sinuses as noted on CT. No change from the recent CT. 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 Electronically Signed   By: Franchot Gallo M.D.   On: 10/26/2015 10:39    EKG:   Orders placed or performed during the hospital encounter of 10/22/15  . ED EKG  . ED EKG  . EKG 12-Lead  . EKG 12-Lead      Management plans discussed with the patient, family and they are in agreement.  CODE STATUS:     Code Status Orders        Start     Ordered   10/22/15 2157  Full code   Continuous     10/22/15 2156      TOTAL TIME TAKING CARE OF THIS PATIENT: 40 minutes.    Theodoro Grist M.D on 10/27/2015 at 2:37 PM  Between 7am to 6pm - Pager - [phone]  After 6pm go to [email or web address] - password [email or web address] [facility]  Office  [phone]  CC: Primary care physician; No primary care provider on file.

## 2015-10-27 NOTE — Progress Notes (Signed)
Discussed discharge instructions and medications with pt's granddaughter.  IV removed per policy and pt transported home via car by her granddaughter.  Clarise Cruz, RN

## 2015-10-27 NOTE — Progress Notes (Signed)
Pharmacy Consult for Electrolyte Management   Allergies  Allergen Reactions  . Shellfish Allergy     Reaction: unknown    Patient Measurements: Height: 5\' 4"  (162.6 cm) Weight: 158 lb 4.8 oz (71.804 kg) IBW/kg (Calculated) : 54.7  Vital Signs: Temp: 98.3 F (36.8 C) (11/02 0524) Temp Source: Axillary (11/02 0524) BP: 145/66 mmHg (11/02 0524) Pulse Rate: 71 (11/02 0524) Intake/Output from previous day: 11/01 0701 - 11/02 0700 In: 1095 [P.O.:240; I.V.:855] Out: 700 [Urine:700] Intake/Output from this shift:    Labs:  Recent Labs  10/25/15 0640  WBC 10.4  HGB 11.5*  HCT 33.2*  PLT 218     Recent Labs  10/24/15 1908 10/25/15 0640 10/25/15 1822 10/26/15 0518 10/27/15 0517  NA 140 141  --  142 143  K 3.2* 3.0* 3.7 3.4* 3.3*  CL 107 108  --  106 108  CO2 28 27  --  30 30  GLUCOSE 164* 151*  --  137* 131*  BUN 24* 19  --  11 9  CREATININE 0.76 0.74  --  0.73 0.75  CALCIUM 8.7* 8.7*  --  9.0 8.9  MG  --   --  1.7 1.7 1.9  PHOS  --   --   --  2.2* 2.5  PROT 5.8*  --   --   --   --   ALBUMIN 2.0*  --   --   --   --   AST 55*  --   --   --   --   ALT 48  --   --   --   --   ALKPHOS 54  --   --   --   --   BILITOT 0.3  --   --   --   --    Estimated Creatinine Clearance: 61.7 mL/min (by C-G formula based on Cr of 0.75).    Recent Labs  10/26/15 1633 10/27/15 0130 10/27/15 0739  GLUCAP 157* 92 114*    Medical History: Past Medical History  Diagnosis Date  . Dysuria   . Seizures ([place])   . Insomnia   . Wheelchair dependence   . Pressure ulcer of foot, stage 2   . Urinary incontinence   . Coronary artery disease   . Stroke ([place])   . Hypertension   . Glaucoma     Medications:  Scheduled:  . antiseptic oral rinse  7 mL Mouth Rinse QID  . azithromycin  500 mg Oral Daily  . brinzolamide  1 drop Both Eyes TID  . cefTRIAXone (ROCEPHIN)  IV  1 g Intravenous Q24H  . chlorhexidine  15 mL Mouth/Throat BID  . cloNIDine  0.2 mg Transdermal Weekly  .  clopidogrel  75 mg Oral Daily  . clotrimazole  1 application Topical BID  . divalproex  500 mg Oral Q12H  . donepezil  5 mg Oral QHS  . enoxaparin (LOVENOX) injection  40 mg Subcutaneous Q24H  . levETIRAcetam  500 mg Oral BID  . metoprolol tartrate  12.5 mg Oral BID  . nitroGLYCERIN  1 inch Topical 3 times per day  . pantoprazole  40 mg Oral Daily  . potassium chloride  10 mEq Intravenous Q1 Hr x 4  . rosuvastatin  20 mg Oral QHS  . sodium chloride  3 mL Intravenous Q12H   Infusions:  . 0.9 % NaCl with KCl 20 mEq / L 75 mL/hr at 10/26/15 1836    Assessment: Pharmacy consulted to assist  in managing electrolytes in this 74 y/o F admitted with AMS and respiratory failure.   K: 3.3, Phos: 2.5, Mag: 1.9  Plan:  Potassium is slightly low at 3.3.  Patient received 20 meq of KCl yesterday and continues to remain low today.  Therefore, will order KCl 40 meq IV x 1 as patient is having trouble swallowing orals.   Patient is receiving K via maintenance IVF, however at the current rate is providing 36 meq K daily.   Will recheck labs in AM. Pharmacy will continue to follow.  Korbyn Vanes G 10/27/2015,8:57 AM

## 2015-11-25 ENCOUNTER — Ambulatory Visit (INDEPENDENT_AMBULATORY_CARE_PROVIDER_SITE_OTHER): Payer: Medicare Other | Admitting: Podiatry

## 2015-11-25 ENCOUNTER — Encounter: Payer: Self-pay | Admitting: Podiatry

## 2015-11-25 VITALS — BP 87/55 | HR 60 | Resp 18

## 2015-11-25 DIAGNOSIS — B351 Tinea unguium: Secondary | ICD-10-CM | POA: Diagnosis not present

## 2015-11-25 DIAGNOSIS — M79676 Pain in unspecified toe(s): Secondary | ICD-10-CM | POA: Diagnosis not present

## 2015-11-25 NOTE — Progress Notes (Signed)
   Subjective:    Patient ID: Debra Hoffman, female    DOB: [birth year], 74 y.o.   MRN: [id number]  HPI  74 year old female presents the office today with her daughter for consent of thick, discolored, elongated toenails which they cannot himself. The patient does have dementia and her history is provided with her granddaughter. She recently moved [place] and was having her toenails trimmed in [place]. No other complaints at this time.   Review of Systems  Constitutional: Positive for appetite change and unexpected weight change.  Genitourinary: Positive for difficulty urinating.  Musculoskeletal:       Muscle pain  Skin:       Change in nails  Neurological: Positive for weakness.  Psychiatric/Behavioral: Positive for behavioral problems.  All other systems reviewed and are negative.      Objective:   Physical Exam  Dermatological: Skin is warm, dry and supple bilateral. Nails hypertrophic, dystrophic, brittle, discolored, elongated x10. There is no surrounding erythema or drainage. There is tenderness to palpation of the nails 1-5 bilaterally. There are no open sores, no preulcerative lesions, no rash or signs of infection present.  Vascular: Dorsalis Pedis artery and Posterior Tibial artery pedal pulses are 1/4 bilateral with immedate capillary fill time.   Neruologic: Achilles reflex intact. Difficult to evaluate protective sensation are vibratory sensation.   Musculoskeletal: No gross boney pedal deformities bilateral. No pain, crepitus, or limitation noted with foot and ankle range of motion bilateral.   Gait: Unassisted, Nonantalgic.       Assessment & Plan:  74 year old female with symptomatic onychomycosis; dementia -Treatment options discussed including all alternatives, risks, and complications - Nails debrided [id number] without complication/bleeding -Discussed daily foot inspection -Follow-up in 3 months or sooner if any problems arise. In the meantime,  encouraged to call the office with any questions, concerns, change in symptoms.   Celesta Gentile, DPM

## 2016-02-24 ENCOUNTER — Ambulatory Visit: Payer: Medicare Other | Admitting: Podiatry

## 2016-07-21 ENCOUNTER — Emergency Department: Payer: Medicare Other

## 2016-07-21 ENCOUNTER — Encounter: Payer: Self-pay | Admitting: Emergency Medicine

## 2016-07-21 ENCOUNTER — Inpatient Hospital Stay
Admission: EM | Admit: 2016-07-21 | Discharge: 2016-07-26 | DRG: 239 | Disposition: A | Discharge status: Skilled Nursing Facility | Payer: Medicare Other | Attending: Internal Medicine | Admitting: Internal Medicine

## 2016-07-21 DIAGNOSIS — E1152 Type 2 diabetes mellitus with diabetic peripheral angiopathy with gangrene: Principal | ICD-10-CM | POA: Diagnosis present

## 2016-07-21 DIAGNOSIS — Z951 Presence of aortocoronary bypass graft: Secondary | ICD-10-CM | POA: Diagnosis not present

## 2016-07-21 DIAGNOSIS — B879 Myiasis, unspecified: Secondary | ICD-10-CM

## 2016-07-21 DIAGNOSIS — E11621 Type 2 diabetes mellitus with foot ulcer: Secondary | ICD-10-CM | POA: Diagnosis present

## 2016-07-21 DIAGNOSIS — L97519 Non-pressure chronic ulcer of other part of right foot with unspecified severity: Secondary | ICD-10-CM | POA: Diagnosis present

## 2016-07-21 DIAGNOSIS — M79671 Pain in right foot: Secondary | ICD-10-CM | POA: Diagnosis present

## 2016-07-21 DIAGNOSIS — D638 Anemia in other chronic diseases classified elsewhere: Secondary | ICD-10-CM | POA: Diagnosis present

## 2016-07-21 DIAGNOSIS — I10 Essential (primary) hypertension: Secondary | ICD-10-CM | POA: Diagnosis present

## 2016-07-21 DIAGNOSIS — Z79899 Other long term (current) drug therapy: Secondary | ICD-10-CM

## 2016-07-21 DIAGNOSIS — Z8673 Personal history of transient ischemic attack (TIA), and cerebral infarction without residual deficits: Secondary | ICD-10-CM

## 2016-07-21 DIAGNOSIS — F0390 Unspecified dementia without behavioral disturbance: Secondary | ICD-10-CM | POA: Diagnosis present

## 2016-07-21 DIAGNOSIS — G4700 Insomnia, unspecified: Secondary | ICD-10-CM | POA: Diagnosis present

## 2016-07-21 DIAGNOSIS — E876 Hypokalemia: Secondary | ICD-10-CM | POA: Diagnosis present

## 2016-07-21 DIAGNOSIS — Z515 Encounter for palliative care: Secondary | ICD-10-CM | POA: Diagnosis present

## 2016-07-21 DIAGNOSIS — Z681 Body mass index (BMI) 19 or less, adult: Secondary | ICD-10-CM

## 2016-07-21 DIAGNOSIS — I2510 Atherosclerotic heart disease of native coronary artery without angina pectoris: Secondary | ICD-10-CM | POA: Diagnosis present

## 2016-07-21 DIAGNOSIS — Z993 Dependence on wheelchair: Secondary | ICD-10-CM

## 2016-07-21 DIAGNOSIS — G3183 Dementia with Lewy bodies: Secondary | ICD-10-CM | POA: Diagnosis not present

## 2016-07-21 DIAGNOSIS — H409 Unspecified glaucoma: Secondary | ICD-10-CM | POA: Diagnosis present

## 2016-07-21 DIAGNOSIS — Z66 Do not resuscitate: Secondary | ICD-10-CM | POA: Diagnosis present

## 2016-07-21 DIAGNOSIS — E43 Unspecified severe protein-calorie malnutrition: Secondary | ICD-10-CM | POA: Diagnosis present

## 2016-07-21 DIAGNOSIS — I96 Gangrene, not elsewhere classified: Secondary | ICD-10-CM | POA: Diagnosis present

## 2016-07-21 LAB — CBC WITH DIFFERENTIAL/PLATELET
Basophils Absolute: 0.1 10*3/uL (ref 0–0.1)
Basophils Relative: 1 %
Eosinophils Absolute: 0 10*3/uL (ref 0–0.7)
Eosinophils Relative: 1 %
HEMATOCRIT: 39.6 % (ref 35.0–47.0)
HEMOGLOBIN: 13.4 g/dL (ref 12.0–16.0)
LYMPHS PCT: 25 %
Lymphs Abs: 1.4 10*3/uL (ref 1.0–3.6)
MCH: 28.8 pg (ref 26.0–34.0)
MCHC: 33.9 g/dL (ref 32.0–36.0)
MCV: 85.1 fL (ref 80.0–100.0)
Monocytes Absolute: 0.3 10*3/uL (ref 0.2–0.9)
Monocytes Relative: 5 %
NEUTROS ABS: 3.7 10*3/uL (ref 1.4–6.5)
Neutrophils Relative %: 68 %
Platelets: 353 10*3/uL (ref 150–440)
RBC: 4.65 MIL/uL (ref 3.80–5.20)
RDW: 14.8 % — AB (ref 11.5–14.5)
WBC: 5.4 10*3/uL (ref 3.6–11.0)

## 2016-07-21 LAB — COMPREHENSIVE METABOLIC PANEL
ALT: 7 U/L — ABNORMAL LOW (ref 14–54)
AST: 18 U/L (ref 15–41)
Albumin: 2.8 g/dL — ABNORMAL LOW (ref 3.5–5.0)
Alkaline Phosphatase: 70 U/L (ref 38–126)
Anion gap: 5 (ref 5–15)
BUN: 19 mg/dL (ref 6–20)
CHLORIDE: 111 mmol/L (ref 101–111)
CO2: 26 mmol/L (ref 22–32)
Calcium: 8.8 mg/dL — ABNORMAL LOW (ref 8.9–10.3)
Creatinine, Ser: 0.37 mg/dL — ABNORMAL LOW (ref 0.44–1.00)
Glucose, Bld: 94 mg/dL (ref 65–99)
POTASSIUM: 3.4 mmol/L — AB (ref 3.5–5.1)
Sodium: 142 mmol/L (ref 135–145)
Total Bilirubin: 0.5 mg/dL (ref 0.3–1.2)
Total Protein: 6.3 g/dL — ABNORMAL LOW (ref 6.5–8.1)

## 2016-07-21 LAB — PROTIME-INR
INR: 1.16
Prothrombin Time: 14.9 seconds (ref 11.4–15.2)

## 2016-07-21 LAB — VALPROIC ACID LEVEL: Valproic Acid Lvl: <10 ug/mL — ABNORMAL LOW (ref 50.0–100.0)

## 2016-07-21 LAB — APTT: APTT: 36 s (ref 24–36)

## 2016-07-21 MED ORDER — HYDROCHLOROTHIAZIDE 12.5 MG PO CAPS
12.5000 mg | ORAL_CAPSULE | Freq: Every day | ORAL | Status: DC
  Administered 2016-07-22 – 2016-07-26 (×4): 12.5 mg via ORAL
  Filled 2016-07-21 (×4): qty 1

## 2016-07-21 MED ORDER — ONDANSETRON HCL 4 MG PO TABS: 4.0000 mg | ORAL_TABLET | Freq: Four times a day (QID) | ORAL | Status: DC | PRN

## 2016-07-21 MED ORDER — IOPAMIDOL (ISOVUE-370) INJECTION 76%
100.0000 mL | Freq: Once | INTRAVENOUS | Status: AC | PRN
  Administered 2016-07-21: 100 mL via INTRAVENOUS

## 2016-07-21 MED ORDER — MORPHINE SULFATE (PF) 2 MG/ML IV SOLN
2.0000 mg | INTRAVENOUS | Status: DC | PRN
  Administered 2016-07-22 (×2): 2 mg via INTRAVENOUS
  Filled 2016-07-21 (×3): qty 1

## 2016-07-21 MED ORDER — DIVALPROEX SODIUM 250 MG PO DR TAB
250.0000 mg | DELAYED_RELEASE_TABLET | Freq: Every morning | ORAL | Status: DC
Start: 2016-07-22 — End: 2016-07-26
  Administered 2016-07-22 – 2016-07-26 (×4): 250 mg via ORAL
  Filled 2016-07-21 (×4): qty 1

## 2016-07-21 MED ORDER — HEPARIN (PORCINE) IN NACL 100-0.45 UNIT/ML-% IJ SOLN
12.0000 [IU]/kg/h | INTRAMUSCULAR | Status: DC
  Filled 2016-07-21: qty 250

## 2016-07-21 MED ORDER — VANCOMYCIN HCL IN DEXTROSE 750-5 MG/150ML-% IV SOLN
750.0000 mg | INTRAVENOUS | Status: DC
  Administered 2016-07-22 (×2): 750 mg via INTRAVENOUS
  Filled 2016-07-21 (×3): qty 150

## 2016-07-21 MED ORDER — ONDANSETRON HCL 4 MG/2ML IJ SOLN
4.0000 mg | Freq: Four times a day (QID) | INTRAMUSCULAR | Status: DC | PRN
  Administered 2016-07-23: 4 mg via INTRAVENOUS

## 2016-07-21 MED ORDER — HEPARIN (PORCINE) IN NACL 100-0.45 UNIT/ML-% IJ SOLN
800.0000 [IU]/h | INTRAMUSCULAR | Status: DC
  Administered 2016-07-22: 800 [IU]/h via INTRAVENOUS
  Filled 2016-07-21: qty 250

## 2016-07-21 MED ORDER — PIPERACILLIN-TAZOBACTAM 3.375 G IVPB
3.3750 g | Freq: Three times a day (TID) | INTRAVENOUS | Status: DC
  Administered 2016-07-21 – 2016-07-23 (×6): 3.375 g via INTRAVENOUS
  Filled 2016-07-21 (×8): qty 50

## 2016-07-21 MED ORDER — VANCOMYCIN HCL IN DEXTROSE 1-5 GM/200ML-% IV SOLN
1000.0000 mg | Freq: Once | INTRAVENOUS | Status: AC
  Administered 2016-07-21: 1000 mg via INTRAVENOUS
  Filled 2016-07-21: qty 200

## 2016-07-21 MED ORDER — HEPARIN BOLUS VIA INFUSION
3000.0000 [IU] | Freq: Once | INTRAVENOUS | Status: DC
  Filled 2016-07-21: qty 3000

## 2016-07-21 MED ORDER — NITROGLYCERIN 0.4 MG SL SUBL: 0.4000 mg | SUBLINGUAL_TABLET | SUBLINGUAL | Status: DC | PRN

## 2016-07-21 MED ORDER — ACETAMINOPHEN 325 MG PO TABS
650.0000 mg | ORAL_TABLET | Freq: Four times a day (QID) | ORAL | Status: DC | PRN
  Administered 2016-07-24: 650 mg via ORAL
  Filled 2016-07-21: qty 2

## 2016-07-21 MED ORDER — ENOXAPARIN SODIUM 40 MG/0.4ML ~~LOC~~ SOLN: 40.0000 mg | SUBCUTANEOUS | Status: DC

## 2016-07-21 MED ORDER — HEPARIN BOLUS VIA INFUSION
3000.0000 [IU] | Freq: Once | INTRAVENOUS | Status: AC
  Administered 2016-07-21: 3000 [IU] via INTRAVENOUS
  Filled 2016-07-21: qty 3000

## 2016-07-21 MED ORDER — SODIUM CHLORIDE 0.9 % IV BOLUS (SEPSIS)
1000.0000 mL | Freq: Once | INTRAVENOUS | Status: AC
  Administered 2016-07-21: 1000 mL via INTRAVENOUS

## 2016-07-21 MED ORDER — DIVALPROEX SODIUM 125 MG PO CSDR: 250.0000 mg | DELAYED_RELEASE_CAPSULE | Freq: Two times a day (BID) | ORAL | Status: DC

## 2016-07-21 MED ORDER — ACETAMINOPHEN 650 MG RE SUPP: 650.0000 mg | Freq: Four times a day (QID) | RECTAL | Status: DC | PRN

## 2016-07-21 MED ORDER — DIVALPROEX SODIUM 250 MG PO DR TAB
500.0000 mg | DELAYED_RELEASE_TABLET | Freq: Every day | ORAL | Status: DC
  Administered 2016-07-22 – 2016-07-25 (×5): 500 mg via ORAL
  Filled 2016-07-21 (×5): qty 2

## 2016-07-21 NOTE — ED Notes (Signed)
Pt transported to room 209 

## 2016-07-21 NOTE — ED Notes (Signed)
Unable to find pulses in either foot with the doppler. Dr. Edd Fabian notified.

## 2016-07-21 NOTE — Progress Notes (Signed)
ANTICOAGULATION CONSULT NOTE - Initial Consult  Pharmacy Consult for heparin Indication: PAD  Allergies  Allergen Reactions  . Shellfish Allergy     Reaction: unknown    Patient Measurements: Height: 5\' 5"  (165.1 cm) Weight: 114 lb 8 oz (51.9 kg) IBW/kg (Calculated) : 57  Vital Signs: Temp: 97.8 F (36.6 C) (07/28 2047) Temp Source: Axillary (07/28 2047) BP: 155/87 (07/28 2047) Pulse Rate: 115 (07/28 2047)  Recent Labs  07/21/16 0929 07/21/16 1038  HGB 13.4  --   HCT 39.6  --   PLT 353  --   CREATININE  --  0.37*    Estimated Creatinine Clearance: 51.3 mL/min (by C-G formula based on SCr of 0.8 mg/dL).   Medical History: Past Medical History:  Diagnosis Date  . Cervicalgia   . Constipation   . Coronary artery disease   . Dementia without behavioral disturbance   . Diabetes mellitus without complication ([place])   . Dysuria   . Glaucoma   . Hypertension   . Insomnia   . Memory loss   . Pressure ulcer of foot, stage 2   . Seizures ([place])   . Stroke ([place])   . Urinary incontinence   . Wheelchair dependence     Assessment: 75 yo female with necrotic wounds on right foot toes. Pharmacy consulted for heparin dosing and monitoring for PAD    Goal of Therapy:  Heparin level 0.3-0.7 units/ml Monitor platelets by anticoagulation protocol: Yes   Plan:  Give 3000 units bolus x 1 Start heparin infusion at 800 units/hr Check anti-Xa level in 8 hours and daily while on heparin Continue to monitor H&H and platelets  Nancy Fetter, PharmD Clinical Pharmacist 07/21/2016 8:53 PM

## 2016-07-21 NOTE — ED Provider Notes (Signed)
[facility] Emergency Department Provider Note   ____________________________________________   First MD Initiated Contact with Patient 07/21/16 [phone]     (approximate)  I have reviewed the triage vital signs and the nursing notes.   HISTORY  Chief Complaint Foot Pain and Wound Infection  Caveat-history of present illness review of systems Limited due to the patient's dementia. All information is obtained from EMS on their arrival as well as family.  HPI Debra Hoffman is a 75 y.o. female with history of dementia, coronary artery disease, diabetes, hypertension, seizures, CVA who presents for evaluation of  wounds in the right foot with concern for maggots. According to EMS, the patient has severe dementia and typically lives with one group of family members. She was visiting different family members today and was complaining of foot pain. Her family noted that several of the toes on her right foot were black and there appeared to be a maggot moving in one of the wounds between her toes. She is brought to the emergency department for further evaluation. The patient is complaining of foot pain but denies any other pain complaints, she is requesting food stating that she has not eaten today but otherwise has no other complaints. She denies any chest pain, abdominal pain, difficulty breathing.   Past Medical History:  Diagnosis Date  . Cervicalgia   . Constipation   . Coronary artery disease   . Dementia without behavioral disturbance   . Diabetes mellitus without complication ([place])   . Dysuria   . Glaucoma   . Hypertension   . Insomnia   . Memory loss   . Pressure ulcer of foot, stage 2   . Seizures ([place])   . Stroke ([place])   . Urinary incontinence   . Wheelchair dependence     Patient Active Problem List   Diagnosis Date Noted  . Acute respiratory failure with hypoxia ([place]) 10/27/2015  . Acute encephalopathy 10/27/2015  . Renal insufficiency  10/27/2015  . Sterile pyuria 10/27/2015  . Diabetes mellitus ([place]) 10/27/2015  . Leukocytosis 10/27/2015  . Generalized weakness 10/27/2015  . Dysphagia 10/27/2015  . Elevated troponin   . Pneumonia 10/22/2015    Past Surgical History:  Procedure Laterality Date  . CARDIAC SURGERY     coronary artery bypass graft  . CARDIAC SURGERY    . CORONARY ARTERY BYPASS GRAFT      Prior to Admission medications   Medication Sig Start Date End Date Taking? Authorizing Provider  cephALEXin (KEFLEX) 500 MG capsule Take 500 mg by mouth 3 (three) times daily. For 7 days 07/14/16  Yes Historical Provider, MD  divalproex (DEPAKOTE SPRINKLE) 125 MG capsule Take 250-500 mg by mouth 2 (two) times daily. Take 250mg  in the morning and 500mg  in the evening.   Yes Historical Provider, MD  hydrochlorothiazide (MICROZIDE) 12.5 MG capsule Take 12.5 mg by mouth daily.   Yes Historical Provider, MD  naproxen (NAPROSYN) 500 MG tablet Take 500 mg by mouth 2 (two) times daily as needed.    Yes Historical Provider, MD  brinzolamide (AZOPT) 1 % ophthalmic suspension Place 1 drop into both eyes 3 (three) times daily.    Historical Provider, MD  carvedilol (COREG) 12.5 MG tablet Take 12.5 mg by mouth 2 (two) times daily with a meal.    Historical Provider, MD  clopidogrel (PLAVIX) 75 MG tablet Take 75 mg by mouth daily.    Historical Provider, MD  clotrimazole (LOTRIMIN) 1 % cream Apply 1 application topically  2 (two) times daily. For 7-14 days 10/19/15   Historical Provider, MD  docusate sodium (COLACE) 100 MG capsule Take 100 mg by mouth daily.    Historical Provider, MD  donepezil (ARICEPT) 5 MG tablet Take 5 mg by mouth at bedtime.    Historical Provider, MD  esomeprazole (NEXIUM) 20 MG capsule Take 20 mg by mouth daily at 12 noon.    Historical Provider, MD  insulin NPH-regular Human (HUMULIN 70/30) (70-30) 100 UNIT/ML injection Inject 15 Units into the skin daily with breakfast.    Historical Provider, MD    isosorbide mononitrate (IMDUR) 30 MG 24 hr tablet Take 1 tablet (30 mg total) by mouth daily. 10/27/15   Theodoro Grist, MD  LORazepam (ATIVAN) 1 MG tablet Take 1 mg by mouth every 4 (four) hours as needed for anxiety (agitation).     Historical Provider, MD  metFORMIN (GLUCOPHAGE) 500 MG tablet Take 500 mg by mouth daily.    Historical Provider, MD  metoprolol tartrate (LOPRESSOR) 25 MG tablet Take 0.5 tablets (12.5 mg total) by mouth 2 (two) times daily. Patient not taking: Reported on 11/25/2015 10/27/15   Theodoro Grist, MD  nitroGLYCERIN (NITROSTAT) 0.4 MG SL tablet Place 0.4 mg under the tongue every 5 (five) minutes as needed for chest pain.    Historical Provider, MD  pantoprazole (PROTONIX) 40 MG tablet Take 40 mg by mouth daily.    Historical Provider, MD  polyethylene glycol (MIRALAX / GLYCOLAX) packet Take 17 g by mouth daily as needed for mild constipation, moderate constipation or severe constipation.    Historical Provider, MD  potassium citrate (UROCIT-K) 10 MEQ (1080 MG) SR tablet Take 10 mEq by mouth 3 (three) times daily with meals.    Historical Provider, MD  potassium citrate (UROCIT-K) 10 MEQ (1080 MG) SR tablet Take 10 mEq by mouth daily.    Historical Provider, MD  psyllium (METAMUCIL SMOOTH TEXTURE) 28 % packet Take 1 packet by mouth See admin instructions. One to two times a day as needed for constipation    Historical Provider, MD  rosuvastatin (CRESTOR) 20 MG tablet Take 20 mg by mouth at bedtime.    Historical Provider, MD  rosuvastatin (CRESTOR) 5 MG tablet Take 5 mg by mouth daily.    Historical Provider, MD  solifenacin (VESICARE) 10 MG tablet Take 10 mg by mouth daily.    Historical Provider, MD  traZODone (DESYREL) 150 MG tablet Take 150 mg by mouth at bedtime.    Historical Provider, MD    Allergies Shellfish allergy  History reviewed. No pertinent family history.  Social History Social History  Substance Use Topics  . Smoking status: Never Smoker  .  Smokeless tobacco: Never Used  . Alcohol use No    Review of Systems   Caveat-history of present illness review of systems Limited due to the patient's dementia. All information is obtained from EMS on their arrival as well as family. ____________________________________________   PHYSICAL EXAM:  Vitals:   07/21/16 1000 07/21/16 1030 07/21/16 1100 07/21/16 1130  BP: (!) 163/89 (!) 158/103 (!) 170/88 (!) 156/93  Pulse: 67   62  Resp: 13 13 12 14   Temp:      TempSrc:      SpO2: 96%   96%  Weight:      Height:       VITAL SIGNS: ED Triage Vitals  Enc Vitals Group     BP      Pulse      Resp  Temp      Temp src      SpO2      Weight      Height      Head Circumference      Peak Flow      Pain Score      Pain Loc      Pain Edu?      Excl. in [place]?     Constitutional: Alert and oriented to self only. Thin and Chronically ill- appearing but in no acute distress. Eyes: Conjunctivae are normal. PERRL. EOMI. Head: Atraumatic. Nose: No congestion/rhinnorhea. Mouth/Throat: Mucous membranes are moist.  Oropharynx non-erythematous. Neck: No stridor. Supple without meningismus. Cardiovascular: Normal rate, regular rhythm. Grossly normal heart sounds.  Good peripheral circulation. Respiratory: Normal respiratory effort.  No retractions. Lungs CTAB. Gastrointestinal: Soft and nontender. No distention.  No CVA tenderness. Genitourinary: deferred Musculoskeletal: Necrosis of the right hallux toe as well as the second and third toes. There is a chronic wound in between the hallux and the second toe and intermittently I'm able to view the head of a tiny maggot. Patient is unable to move the right hallux, second and third toes which appear petrified. In the left foot, the patient has good mobility and sensation in the toes. Unable to doppler DP or PT pulse in either extremity. Neurologic:  Normal speech and language. No gross focal neurologic deficits are appreciated.  Skin:  Skin  is warm, dry and intact. No rash noted. Psychiatric: Mood and affect are normal. Speech and behavior are normal.  ____________________________________________   LABS (all labs ordered are listed, but only abnormal results are displayed)  Labs Reviewed  CBC WITH DIFFERENTIAL/PLATELET - Abnormal; Notable for the following:       Result Value   RDW 14.8 (*)    All other components within normal limits  VALPROIC ACID LEVEL - Abnormal; Notable for the following:    Valproic Acid Lvl <10 (*)    All other components within normal limits  COMPREHENSIVE METABOLIC PANEL - Abnormal; Notable for the following:    Potassium 3.4 (*)    Creatinine, Ser 0.37 (*)    Calcium 8.8 (*)    Total Protein 6.3 (*)    Albumin 2.8 (*)    ALT 7 (*)    All other components within normal limits   ____________________________________________  EKG  ED ECG REPORT I, Joanne Gavel, the attending physician, personally viewed and interpreted this ECG.   Date: 07/21/2016  EKG Time: 08:49  Rate: 64  Rhythm: normal sinus rhythm  Axis: normal  Intervals:right bundle branch block  ST&T Change: No acute ST elevation or acute ST depression. Q waves in the inferior leads.  ____________________________________________  RADIOLOGY  Xray right foot IMPRESSION: No acute abnormality. 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 Atherosclerosis.  CTA aortobifeomoral IMPRESSION: Diffuse atherosclerosis of abdominal aorta and all visualized vasculature is noted. There is no evidence of abdominal aortic aneurysm or dissection. Severe atheromatous disease is seen involving the middle and distal portions of both superficial femoral arteries, resulting in multifocal stenoses and complete occlusion of there distal portions. Both popliteal arteries are occluded. Collaterals are seen leading to reconstitution of the proximal portions of the right anterior tibial and peroneal arteries, both of which are seen to  occlude in the mid calf. Reconstitution of the most distal portion of the right anterior tibial artery and dorsalis pedis artery is noted through collaterals. Occlusion of the right posterior tibial artery is noted. Collaterals  are seen leading to reconstitution of the proximal portions of the left anterior tibial and peroneal arteries. The left anterior tibial artery is seen to occlude proximally. The left peritoneal artery is seen flowing continuously toward the ankle mortise. ____________________________________________   PROCEDURES  Procedure(s) performed: None  Procedures  Critical Care performed: No  ____________________________________________   INITIAL IMPRESSION / ASSESSMENT AND PLAN / ED COURSE  Pertinent labs & imaging results that were available during my care of the patient were reviewed by me and considered in my medical decision making (see chart for details).  Debra Hoffman is a 75 y.o. female with history of dementia, coronary artery disease, diabetes, hypertension, seizures, CVA who presents for evaluation of  wounds in the right foot with concern for maggots. On exam, she is nontoxic appearing, chronically ill, in no acute distress. Vitals stable and she is afebrile. She does appear to have gangrenous necrosis of the right hallux as well as the second and third toes as well as a maggot in between the first and second toe which appears to be coming from a chronic wound. We'll obtain screening labs, plain films to evaluate for osteomyelitis and discuss with surgery.  ----------------------------------------- 1:35 PM on 07/21/2016 ----------------------------------------- I discussed CTA results with Dr. Ronalee Belts and given patient's complete occlusions of bilateral popliteal arteries, complete occlusion of superficial femoral arteries, the only surgical intervention would be an above-knee amputation. The patient is followed by hospice for end-stage dementia. I  discussed this option with her granddaughter Miss Garnet Koyanagi who does not want to proceed with an amputation. According to Dr. Ronalee Belts, the only other treatment would be admission for podiatry debridement with wound VAC and family is comfortable with that. I have discussed the case with the hospitalist, Dr. Margaretmary Eddy for admission at this time.   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    Joanne Gavel, MD 07/21/16 [phone]

## 2016-07-21 NOTE — Consult Note (Signed)
Reason for Consult: Gangrenous changes right foot with significant peripheral vascular disease. Referring Physician: [facility].  Debra Hoffman is an 75 y.o. female.  HPI: The patient has a significant history of peripheral vascular disease previously evaluated by Dr.Schnier at [facility]. In speaking with Dr. Delana Meyer it has been previously established that she has a non-reconstructable vascular compromise leg. Recently was away on a trip and when she returned the granddaughter noticed some discolorations in her right foot. She has subsequently developed some gangrenous changes as well as some concern for possible maggot infestation.  Past Medical History:  Diagnosis Date  . Cervicalgia   . Constipation   . Coronary artery disease   . Dementia without behavioral disturbance   . Diabetes mellitus without complication ([place])   . Dysuria   . Glaucoma   . Hypertension   . Insomnia   . Memory loss   . Pressure ulcer of foot, stage 2   . Seizures ([place])   . Stroke ([place])   . Urinary incontinence   . Wheelchair dependence     Past Surgical History:  Procedure Laterality Date  . CARDIAC SURGERY     coronary artery bypass graft  . CARDIAC SURGERY    . CORONARY ARTERY BYPASS GRAFT      Family History  Problem Relation Age of Onset  . Heart disease Mother   . Heart disease Father     Social History:  reports that she has never smoked. She has never used smokeless tobacco. She reports that she does not drink alcohol or use drugs.  Allergies:  Allergies  Allergen Reactions  . Shellfish Allergy     Reaction: unknown    Medications:  Scheduled: . [START ON 07/22/2016] divalproex  250 mg Oral q morning - 10a  . divalproex  500 mg Oral QHS  . enoxaparin (LOVENOX) injection  40 mg Subcutaneous Q24H  . [START ON 07/22/2016] hydrochlorothiazide  12.5 mg Oral Daily  . piperacillin-tazobactam (ZOSYN)  IV  3.375 g Intravenous Q8H  . [START ON 07/22/2016]  vancomycin  750 mg Intravenous Q18H    Results for orders placed or performed during the hospital encounter of 07/21/16 (from the past 48 hour(s))  CBC with Differential     Status: Abnormal   Collection Time: 07/21/16  9:29 AM  Result Value Ref Range   WBC 5.4 3.6 - 11.0 K/uL   RBC 4.65 3.80 - 5.20 MIL/uL   Hemoglobin 13.4 12.0 - 16.0 g/dL   HCT 39.6 35.0 - 47.0 %   MCV 85.1 80.0 - 100.0 fL   MCH 28.8 26.0 - 34.0 pg   MCHC 33.9 32.0 - 36.0 g/dL   RDW 14.8 (H) 11.5 - 14.5 %   Platelets 353 150 - 440 K/uL   Neutrophils Relative % 68 %   Neutro Abs 3.7 1.4 - 6.5 K/uL   Lymphocytes Relative 25 %   Lymphs Abs 1.4 1.0 - 3.6 K/uL   Monocytes Relative 5 %   Monocytes Absolute 0.3 0.2 - 0.9 K/uL   Eosinophils Relative 1 %   Eosinophils Absolute 0.0 0 - 0.7 K/uL   Basophils Relative 1 %   Basophils Absolute 0.1 0 - 0.1 K/uL  Valproic acid level     Status: Abnormal   Collection Time: 07/21/16  9:29 AM  Result Value Ref Range   Valproic Acid Lvl <10 (L) 50.0 - 100.0 ug/mL  Comprehensive metabolic panel     Status: Abnormal  Collection Time: 07/21/16 10:38 AM  Result Value Ref Range   Sodium 142 135 - 145 mmol/L   Potassium 3.4 (L) 3.5 - 5.1 mmol/L   Chloride 111 101 - 111 mmol/L   CO2 26 22 - 32 mmol/L   Glucose, Bld 94 65 - 99 mg/dL   BUN 19 6 - 20 mg/dL   Creatinine, Ser 0.37 (L) 0.44 - 1.00 mg/dL   Calcium 8.8 (L) 8.9 - 10.3 mg/dL   Total Protein 6.3 (L) 6.5 - 8.1 g/dL   Albumin 2.8 (L) 3.5 - 5.0 g/dL   AST 18 15 - 41 U/L   ALT 7 (L) 14 - 54 U/L   Alkaline Phosphatase 70 38 - 126 U/L   Total Bilirubin 0.5 0.3 - 1.2 mg/dL   GFR calc non Af Amer >60 >60 mL/min   GFR calc Af Amer >60 >60 mL/min    Comment: (NOTE) The eGFR has been calculated using the CKD EPI equation. This calculation has not been validated in all clinical situations. eGFR's persistently <60 mL/min signify possible Chronic Kidney Disease.    Anion gap 5 5 - 15    Ct Angio Aortobifemoral W And/or Wo  Contrast  Result Date: 07/21/2016 CLINICAL DATA:  Necrotic right foot. EXAM: CT ANGIOGRAPHY OF ABDOMINAL AORTA WITH ILIOFEMORAL RUNOFF TECHNIQUE: Multidetector CT imaging of the abdomen, pelvis and lower extremities was performed using the standard protocol during bolus administration of intravenous contrast. Multiplanar CT image reconstructions and MIPs were obtained to evaluate the vascular anatomy. CONTRAST:  100 mL of Isovue 370 intravenously. COMPARISON:  None. FINDINGS: Vascular Diffuse atherosclerosis of abdominal aorta and all visualized vasculature is noted. There is no evidence of abdominal aortic aneurysm or dissection. Mesenteric and renal arteries are widely patent without significant stenosis. Iliac arteries are heavily calcified, but no significant stenosis is noted. Common femoral arteries are widely patent. Severe atheromatous disease is noted throughout the right superficial femoral artery resulting in multifocal stenoses and occlusion in its distal portion. The right popliteal artery is occluded. Collaterals are seen leading to reconstitution of the proximal portions of the anterior tibial and peroneal arteries, although these are seen to occlude in the mid calf. Collaterals are seen leading to reconstitution of the distal portion the anterior tibial artery and dorsalis pedis artery on the right. Severe atheromatous disease is seen involving the middle and distal portion of the left superficial from artery, resulting in multifocal stenoses and occlusion of its distal portion. Left popliteal artery is occluded. Collaterals are seen leading to reconstitution of the proximal portions of the anterior tibial and peroneal arteries. The left anterior tibial artery is seen to occlude proximally. Left peroneal artery is seen flowing toward the ankle mortise. Non Vascular Visualized lung bases are unremarkable. No definite abnormality is noted in the liver, spleen or pancreas. Adrenal glands and kidneys  appear normal. No gallstones are noted. There is no evidence of bowel obstruction. No abnormal fluid collection is noted. Urinary bladder appears normal. Mild multilevel degenerative disc disease is noted in the lower lumbar spine. IMPRESSION: Diffuse atherosclerosis of abdominal aorta and all visualized vasculature is noted. There is no evidence of abdominal aortic aneurysm or dissection. Severe atheromatous disease is seen involving the middle and distal portions of both superficial femoral arteries, resulting in multifocal stenoses and complete occlusion of there distal portions. Both popliteal arteries are occluded. Collaterals are seen leading to reconstitution of the proximal portions of the right anterior tibial and peroneal arteries, both of which are  seen to occlude in the mid calf. Reconstitution of the most distal portion of the right anterior tibial artery and dorsalis pedis artery is noted through collaterals. Occlusion of the right posterior tibial artery is noted. Collaterals are seen leading to reconstitution of the proximal portions of the left anterior tibial and peroneal arteries. The left anterior tibial artery is seen to occlude proximally. The left peritoneal artery is seen flowing continuously toward the ankle mortise. Electronically Signed   By: Marijo Conception, M.D.   On: 07/21/2016 13:07  Dg Foot Complete Right  Result Date: 07/21/2016 CLINICAL DATA:  Right foot necrosis and pain.  Initial encounter. EXAM: RIGHT FOOT COMPLETE - 3+ VIEW COMPARISON:  None. FINDINGS: No acute bony or joint abnormality is identified. No soft tissue gas collection is seen. Mild hallux valgus deformity and first MTP osteoarthritis are seen. No radiopaque foreign body is identified. No bony destructive change or periosteal reaction is identified. Atherosclerotic vascular disease is noted. IMPRESSION: No acute abnormality. 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 Atherosclerosis. Electronically Signed    By: Inge Rise M.D.   On: 07/21/2016 09:36   Review of Systems  Unable to perform ROS: Dementia   Blood pressure (!) 161/91, pulse (!) 53, temperature 97.4 F (36.3 C), temperature source Axillary, resp. rate 16, height 5' 5"  (1.651 m), weight 51.9 kg (114 lb 8 oz), SpO2 99 %. Physical Exam  Cardiovascular:  DP and PT pulses are absent bilateral.  Musculoskeletal:  Stiff motion in the pedal joints. Digital contractures are noted. Muscle testing is deferred.  Neurological:  Sensation not able to be truly assessed. She does have a significant pain reaction  Skin:  The skin is very cool to the touch and then dry and atrophic. Gangrenous changes are noted in the first second and third toes of the right foot. A full thickness ulcerative area in the first interspace at the base of the great toe is noted which probes down to the level of bone. Significant necrotic tissue with a significant infestation of maggots. Some skin slough along the plantar forefoot with maggots beneath the flap.    Assessment/Plan: Assessment: 1. Peripheral vascular disease with gangrenous changes right forefoot. 2. Full-thickness ulceration great toe with necrotic tissue and maggot infestation.  Plan: The plantar skin flap was excisionally debrided as well as the ulcerative area on the great toe full-thickness including some of the deeper subcutaneous tissue. using scissors from a suture kit. At least a dozen maggots were also excisionally debrided from the wound. Betadine wet-to-dry dressing packed within the ulcerative area as well as the plantar area around the gangrenous toes. Again we did speak with vascular surgery who reiterated the fact that the patient has no significant healing potential of anything in her leg and she will require an above-knee amputation if any intervention is agreed upon. Otherwise she should just have palliative care at this point.  Durward Fortes 07/21/2016, 7:55 PM

## 2016-07-21 NOTE — Consult Note (Signed)
[facility] Vascular Consult Note  MRN : [id number]  Debra Hoffman is a 75 y.o. (10-10-1941) female who presents with chief complaint of  Chief Complaint  Patient presents with  . Foot Pain  . Wound Infection   History of Present Illness:  Information obtained from chart as patient has dementia and is hospice status. No family available at bedside.  Debra Hoffman is a 75 y.o. female with a known history of dementia, history of seizures, diabetes, hypertension, coronary artery disease, history of previous CVA who presents to the hospital from home hospice due to pain in her right lower extremity and necrotic-looking right toes. Patient herself has dementia and therefore most of the history obtained over the phone with the granddaughter.   As per the granddaughter, the patient went to visit some family members out of town and returned about a week or so ago and noticed her grandmothers right foot was somewhat dark in color. Over the past few days, patient's pain has been uncontrollable and she has noticed some maggots on the wound on her right foot.   Patient presented to the ER due to uncontrollable right foot pain and was noted to have significant vascular disease as noted on the CT angiogram "Diffuse atherosclerosis of abdominal aorta and all visualized vasculature is noted. There is no evidence of abdominal aortic aneurysm or dissection. Severe atheromatous disease is seen involving the middle and distal portions of both superficial femoral arteries, resulting in multifocal stenoses and complete occlusion of there distal portions. Both popliteal arteries are occluded. Collaterals are seen leading to reconstitution of the proximal portions of the right anterior tibial and peroneal arteries, both of which are seen to occlude in the mid calf. Reconstitution of the most distal portion of the right anterior tibial artery and dorsalis pedis artery is noted through  collaterals. Occlusion of the right posterior tibial artery is noted. Collaterals are seen leading to reconstitution of the proximal portions of the left anterior tibial and peroneal arteries. The left anterior tibial artery is seen to occlude proximally. The left peritoneal artery is seen flowing continuously toward the ankle mortise."  Vascular surgery was consulted by Dr. Verdell Carmine for further recommendations.   Current Facility-Administered Medications  Medication Dose Route Frequency Provider Last Rate Last Dose  . acetaminophen (TYLENOL) tablet 650 mg  650 mg Oral Q6H PRN Henreitta Leber, MD       Or  . acetaminophen (TYLENOL) suppository 650 mg  650 mg Rectal Q6H PRN Henreitta Leber, MD      . Derrill Memo ON 07/22/2016] divalproex (DEPAKOTE) DR tablet 250 mg  250 mg Oral q morning - 10a Henreitta Leber, MD      . divalproex (DEPAKOTE) DR tablet 500 mg  500 mg Oral QHS Henreitta Leber, MD      . enoxaparin (LOVENOX) injection 40 mg  40 mg Subcutaneous Q24H Henreitta Leber, MD      . Derrill Memo ON 07/22/2016] hydrochlorothiazide (MICROZIDE) capsule 12.5 mg  12.5 mg Oral Daily Henreitta Leber, MD      . morphine 2 MG/ML injection 2 mg  2 mg Intravenous Q3H PRN Henreitta Leber, MD      . nitroGLYCERIN (NITROSTAT) SL tablet 0.4 mg  0.4 mg Sublingual Q5 min PRN Henreitta Leber, MD      . ondansetron (ZOFRAN) tablet 4 mg  4 mg Oral Q6H PRN Henreitta Leber, MD       Or  .  ondansetron (ZOFRAN) injection 4 mg  4 mg Intravenous Q6H PRN Henreitta Leber, MD      . piperacillin-tazobactam (ZOSYN) IVPB 3.375 g  3.375 g Intravenous Q8H Sheema M Hallaji, RPH      . vancomycin (VANCOCIN) IVPB 1000 mg/200 mL premix  1,000 mg Intravenous Once [employer], RPH      . [START ON 07/22/2016] vancomycin (VANCOCIN) IVPB 750 mg/150 ml premix  750 mg Intravenous Q18H Pernell Dupre, RPH       Past Medical History:  Diagnosis Date  . Cervicalgia   . Constipation   . Coronary artery disease   . Dementia without  behavioral disturbance   . Diabetes mellitus without complication ([place])   . Dysuria   . Glaucoma   . Hypertension   . Insomnia   . Memory loss   . Pressure ulcer of foot, stage 2   . Seizures ([place])   . Stroke ([place])   . Urinary incontinence   . Wheelchair dependence    Past Surgical History:  Procedure Laterality Date  . CARDIAC SURGERY     coronary artery bypass graft  . CARDIAC SURGERY    . CORONARY ARTERY BYPASS GRAFT     Social History Social History  Substance Use Topics  . Smoking status: Never Smoker  . Smokeless tobacco: Never Used  . Alcohol use No   Family History Family History  Problem Relation Age of Onset  . Heart disease Mother   . Heart disease Father   Unable to obtain family history from patient due to dementia.  Allergies  Allergen Reactions  . Shellfish Allergy     Reaction: unknown   REVIEW OF SYSTEMS (Negative unless checked)  **Unable to obtain Review of Systems from patient due to dementia**  Constitutional: [] Weight loss  [] Fever  [] Chills Cardiac: [] Chest pain   [] Chest pressure   [] Palpitations   [] Shortness of breath when laying flat   [] Shortness of breath at rest   [] Shortness of breath with exertion. Vascular:  [] Pain in legs with walking   [] Pain in legs at rest   [] Pain in legs when laying flat   [] Claudication   [] Pain in feet when walking  [] Pain in feet at rest  [] Pain in feet when laying flat   [] History of DVT   [] Phlebitis   [] Swelling in legs   [] Varicose veins   [] Non-healing ulcers Pulmonary:   [] Uses home oxygen   [] Productive cough   [] Hemoptysis   [] Wheeze  [] COPD   [] Asthma Neurologic:  [] Dizziness  [] Blackouts   [] Seizures   [] History of stroke   [] History of TIA  [] Aphasia   [] Temporary blindness   [] Dysphagia   [] Weakness or numbness in arms   [] Weakness or numbness in legs Musculoskeletal:  [] Arthritis   [] Joint swelling   [] Joint pain   [] Low back pain Hematologic:  [] Easy bruising  [] Easy bleeding   [] Hypercoagulable  state   [] Anemic  [] Hepatitis Gastrointestinal:  [] Blood in stool   [] Vomiting blood  [] Gastroesophageal reflux/heartburn   [] Difficulty swallowing. Genitourinary:  [] Chronic kidney disease   [] Difficult urination  [] Frequent urination  [] Burning with urination   [] Blood in urine Skin:  [] Rashes   [] Ulcers   [] Wounds Psychological:  [] History of anxiety   []  History of major depression.  Physical Examination  Vitals:   07/21/16 1430 07/21/16 1500 07/21/16 1530 07/21/16 1636  BP: (!) 157/86 (!) 166/92 (!) 153/89 (!) 161/91  Pulse:  73  (!) 53  Resp: Marland Kitchen)  22 14 14 16   Temp:    97.4 F (36.3 C)  TempSrc:    Axillary  SpO2:  95%  99%  Weight:      Height:       Body mass index is 19.05 kg/m. Gen:  Confused, doesn't answer questions appropriately Head: [place]/AT, No temporalis wasting. Prominent temp pulse not noted. Ear/Nose/Throat: Hearing grossly intact, nares w/o erythema or drainage, oropharynx w/o Erythema/Exudate Eyes: PERRLA, EOMI.  Neck: Supple, no nuchal rigidity.  No  JVD.  Pulmonary:  Good air movement, clear to auscultation bilaterally.  Cardiac: RRR, normal S1, S2, no Murmurs, rubs or gallops. Vascular:  Vessel Right Left  Radial Palpable Palpable  Ulnar Palpable Palpable  Brachial Palpable Palpable  Carotid Palpable, without bruit Palpable, without bruit  Aorta Not palpable N/A  Femoral Palpable Palpable  Popliteal Non-Palpable Non-Palpable  PT Non-Palpable Non-Palpable  DP Non-Palpable Non-Palpable   Right Lower Extremity: Dry gangrene of the first, second and third toes. No drainage noted. No cellulitis noted. No open wounds noted.   Gastrointestinal: soft, non-tender/non-distended. No guarding/reflex. No masses. Musculoskeletal: Weakness noted both upper and lower extremity.  Neurologic: CN 2-12 intact. Pain and light touch intact in extremities.  Symmetrical.  Psychiatric: Patient with dementia Lymph : No Cervical, Axillary, or Inguinal  lymphadenopathy.  CBC Lab Results  Component Value Date   WBC 5.4 07/21/2016   HGB 13.4 07/21/2016   HCT 39.6 07/21/2016   MCV 85.1 07/21/2016   PLT 353 07/21/2016   BMET    Component Value Date/Time   NA 142 07/21/2016 1038   NA 139 03/21/2015 1130   K 3.4 (L) 07/21/2016 1038   K 3.7 03/21/2015 1130   CL 111 07/21/2016 1038   CL 102 03/21/2015 1130   CO2 26 07/21/2016 1038   CO2 29 03/21/2015 1130   GLUCOSE 94 07/21/2016 1038   GLUCOSE 184 (H) 03/21/2015 1130   BUN 19 07/21/2016 1038   BUN 19 03/21/2015 1130   CREATININE 0.37 (L) 07/21/2016 1038   CREATININE 1.02 (H) 03/21/2015 1130   CALCIUM 8.8 (L) 07/21/2016 1038   CALCIUM 9.5 03/21/2015 1130   GFRNONAA >60 07/21/2016 1038   GFRNONAA 55 (L) 03/21/2015 1130   GFRAA >60 07/21/2016 1038   GFRAA >60 03/21/2015 1130   Estimated Creatinine Clearance: 51.3 mL/min (by C-G formula based on SCr of 0.8 mg/dL).  COAG Lab Results  Component Value Date   INR 1.25 10/23/2015   Radiology Ct Angio Aortobifemoral W And/or Wo Contrast  Result Date: 07/21/2016 CLINICAL DATA:  Necrotic right foot. EXAM: CT ANGIOGRAPHY OF ABDOMINAL AORTA WITH ILIOFEMORAL RUNOFF TECHNIQUE: Multidetector CT imaging of the abdomen, pelvis and lower extremities was performed using the standard protocol during bolus administration of intravenous contrast. Multiplanar CT image reconstructions and MIPs were obtained to evaluate the vascular anatomy. CONTRAST:  100 mL of Isovue 370 intravenously. COMPARISON:  None. FINDINGS: Vascular Diffuse atherosclerosis of abdominal aorta and all visualized vasculature is noted. There is no evidence of abdominal aortic aneurysm or dissection. Mesenteric and renal arteries are widely patent without significant stenosis. Iliac arteries are heavily calcified, but no significant stenosis is noted. Common femoral arteries are widely patent. Severe atheromatous disease is noted throughout the right superficial femoral artery  resulting in multifocal stenoses and occlusion in its distal portion. The right popliteal artery is occluded. Collaterals are seen leading to reconstitution of the proximal portions of the anterior tibial and peroneal arteries, although these are seen to occlude in the mid  calf. Collaterals are seen leading to reconstitution of the distal portion the anterior tibial artery and dorsalis pedis artery on the right. Severe atheromatous disease is seen involving the middle and distal portion of the left superficial from artery, resulting in multifocal stenoses and occlusion of its distal portion. Left popliteal artery is occluded. Collaterals are seen leading to reconstitution of the proximal portions of the anterior tibial and peroneal arteries. The left anterior tibial artery is seen to occlude proximally. Left peroneal artery is seen flowing toward the ankle mortise. Non Vascular Visualized lung bases are unremarkable. No definite abnormality is noted in the liver, spleen or pancreas. Adrenal glands and kidneys appear normal. No gallstones are noted. There is no evidence of bowel obstruction. No abnormal fluid collection is noted. Urinary bladder appears normal. Mild multilevel degenerative disc disease is noted in the lower lumbar spine. IMPRESSION: Diffuse atherosclerosis of abdominal aorta and all visualized vasculature is noted. There is no evidence of abdominal aortic aneurysm or dissection. Severe atheromatous disease is seen involving the middle and distal portions of both superficial femoral arteries, resulting in multifocal stenoses and complete occlusion of there distal portions. Both popliteal arteries are occluded. Collaterals are seen leading to reconstitution of the proximal portions of the right anterior tibial and peroneal arteries, both of which are seen to occlude in the mid calf. Reconstitution of the most distal portion of the right anterior tibial artery and dorsalis pedis artery is noted through  collaterals. Occlusion of the right posterior tibial artery is noted. Collaterals are seen leading to reconstitution of the proximal portions of the left anterior tibial and peroneal arteries. The left anterior tibial artery is seen to occlude proximally. The left peritoneal artery is seen flowing continuously toward the ankle mortise. Electronically Signed   By: Marijo Conception, M.D.   On: 07/21/2016 13:07  Dg Foot Complete Right  Result Date: 07/21/2016 CLINICAL DATA:  Right foot necrosis and pain.  Initial encounter. EXAM: RIGHT FOOT COMPLETE - 3+ VIEW COMPARISON:  None. FINDINGS: No acute bony or joint abnormality is identified. No soft tissue gas collection is seen. Mild hallux valgus deformity and first MTP osteoarthritis are seen. No radiopaque foreign body is identified. No bony destructive change or periosteal reaction is identified. Atherosclerotic vascular disease is noted. IMPRESSION: No acute abnormality. 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 Atherosclerosis. Electronically Signed   By: Inge Rise M.D.   On: 07/21/2016 09:36  Assessment/Plan 75 year old female with known history of dementia, history of seizures, diabetes, hypertension, coronary artery disease, history of previous CVA who presents to the hospital from home hospice due right lower extremity pain and gangrene of her right first, second and third toes. 1) Atherosclerotic Disease with Gangrene - would start heparin gtt, this may alleviate some of the patients pain. Do not recommend right lower extremity angiogram. Based on patients CTA she is non-reconstructable endovascularly. Patient would need a right AKA as this level would allow for adequate healing. We are happy to move forward with amputation if patients family is in agreement.  2) Discussed with Dr. Francene Castle, PA-C  07/21/2016 6:02 PM

## 2016-07-21 NOTE — ED Triage Notes (Signed)
Pt arrived via EMS from home with reports of a necrotic right foot and family reported to EMS that there was a maggot present.  Pt has hx of injury to toe per EMS.  Pt has a hx of renal failure and per EMS from family pt is at the end stage of failure of thrive. Pt lives at home with granddaughter.

## 2016-07-21 NOTE — Progress Notes (Signed)
Unable to complete admission profile due to patient has dementia and no family at bedside. Attempted to reach family member but unsuccessful. Left a message but has not return a call at this  time.

## 2016-07-21 NOTE — ED Notes (Signed)
Spoke with Leda Quail [phone] patient's granddaugther, who states she is trying to get up to the hospital.  She states pt is under hospice care through [facility] and a nurse is supposed to be coming to the hospital. Informed Dr. Edd Fabian of this.

## 2016-07-21 NOTE — Care Management Note (Signed)
Case Management Note  Patient Details  Name: Tangila Nelle MRN: [id number] Date of Birth: June 22, 1941  Subjective/Objective:  Spoke to [employer] from [place]. He states there is no gameplan for the care of the patient. He says the notes for the patient confirm the pt. Has nursing 2 x weekly. Last notes describe the area as necrotic with no drainage.  Action/Plan:   Expected Discharge Date:                  Expected Discharge Plan:     In-House Referral:     Discharge planning Services     Post Acute Care Choice:    Choice offered to:     DME Arranged:    DME Agency:     HH Arranged:    HH Agency:     Status of Service:     If discussed at [employer] of Stay Meetings, dates discussed:    Additional Comments:  Beau Fanny, RN 07/21/2016, 1:28 PM

## 2016-07-21 NOTE — H&P (Signed)
[facility] [place] NAME: Debra Hoffman    MR#:  [id number]  DATE OF BIRTH:  1941/01/27  DATE OF ADMISSION:  07/21/2016  PRIMARY CARE PHYSICIAN: Sabino Snipes KEY, MD   REQUESTING/REFERRING PHYSICIAN: Dr. Darrick Penna  CHIEF COMPLAINT:   Chief Complaint  Patient presents with  . Foot Pain  . Wound Infection    HISTORY OF PRESENT ILLNESS:  Debra Hoffman  is a 75 y.o. female with a known history of Dementia, history of seizures, alcohol,, diabetes, hypertension, coronary artery disease, history of previous CVA who presents to the hospital from home due to pain in her right lower extremity and necrotic-looking right toes. Patient herself has dementia and therefore most of the history obtained over the phone with the granddaughter. As per the granddaughter the patient went to visit some family members out of town and returned about a week or so ago and she is that her grandmothers right foot was somewhat dark in color. She thought she had some trauma to the foot and attempts to treat her pain accordingly as she is followed by hospice. Over the past few days patient's pain has been uncontrollable and she has noticed some maggots on the wound on her right foot. Patient presented to the ER due to uncontrollable right foot pain and was noted to have significant vascular disease as noted on the CT angiogram mentioned below. Hospitalist services were contacted further treatment and evaluation.  PAST MEDICAL HISTORY:   Past Medical History:  Diagnosis Date  . Cervicalgia   . Constipation   . Coronary artery disease   . Dementia without behavioral disturbance   . Diabetes mellitus without complication ([place])   . Dysuria   . Glaucoma   . Hypertension   . Insomnia   . Memory loss   . Pressure ulcer of foot, stage 2   . Seizures ([place])   . Stroke ([place])   . Urinary incontinence   . Wheelchair dependence     PAST SURGICAL HISTORY:   Past  Surgical History:  Procedure Laterality Date  . CARDIAC SURGERY     coronary artery bypass graft  . CARDIAC SURGERY    . CORONARY ARTERY BYPASS GRAFT      SOCIAL HISTORY:   Social History  Substance Use Topics  . Smoking status: Never Smoker  . Smokeless tobacco: Never Used  . Alcohol use No    FAMILY HISTORY:   Family History  Problem Relation Age of Onset  . Heart disease Mother   . Heart disease Father     DRUG ALLERGIES:   Allergies  Allergen Reactions  . Shellfish Allergy     Reaction: unknown    REVIEW OF SYSTEMS:   Review of Systems  Unable to perform ROS: Dementia    MEDICATIONS AT HOME:   Prior to Admission medications   Medication Sig Start Date End Date Taking? Authorizing Provider  cephALEXin (KEFLEX) 500 MG capsule Take 500 mg by mouth 3 (three) times daily. For 7 days 07/14/16  Yes Historical Provider, MD  divalproex (DEPAKOTE SPRINKLE) 125 MG capsule Take 250-500 mg by mouth 2 (two) times daily. Take 250mg  in the morning and 500mg  in the evening.   Yes Historical Provider, MD  hydrochlorothiazide (MICROZIDE) 12.5 MG capsule Take 12.5 mg by mouth daily.   Yes Historical Provider, MD  naproxen (NAPROSYN) 500 MG tablet Take 500 mg by mouth 2 (two) times daily as needed.    Yes Historical  Provider, MD  isosorbide mononitrate (IMDUR) 30 MG 24 hr tablet Take 1 tablet (30 mg total) by mouth daily. 10/27/15   Theodoro Grist, MD  metoprolol tartrate (LOPRESSOR) 25 MG tablet Take 0.5 tablets (12.5 mg total) by mouth 2 (two) times daily. Patient not taking: Reported on 11/25/2015 10/27/15   Theodoro Grist, MD  nitroGLYCERIN (NITROSTAT) 0.4 MG SL tablet Place 0.4 mg under the tongue every 5 (five) minutes as needed for chest pain.    Historical Provider, MD      VITAL SIGNS:  Blood pressure (!) 166/92, pulse 73, temperature 97.7 F (36.5 C), temperature source Oral, resp. rate 14, height 5\' 5"  (1.651 m), weight 51.9 kg (114 lb 8 oz), SpO2 95 %.  PHYSICAL  EXAMINATION:  Physical Exam  GENERAL:  75 y.o.-year-old patient lying in the bed in acute distress.  EYES: Pupils equal, round, reactive to light and accommodation. No scleral icterus. Extraocular muscles intact.  HEENT: Head atraumatic, normocephalic. Oropharynx and nasopharynx clear. No oropharyngeal erythema, moist oral mucosa  NECK:  Supple, no jugular venous distention. No thyroid enlargement, no tenderness.  LUNGS: Normal breath sounds bilaterally, no wheezing, rales, rhonchi. No use of accessory muscles of respiration.  CARDIOVASCULAR: S1, S2 RRR. No murmurs, rubs, gallops, clicks.  ABDOMEN: Soft, nontender, nondistended. Bowel sounds present. No organomegaly or mass.  EXTREMITIES: No pedal edema, cyanosis, or clubbing. + 2 pedal & radial pulses b/l.  Necrotic-looking right foot with the first 3 toes appearing necrotic. No obvious drainage noted. No maggots noted. Patient with significant pain on just mild palpation. NEUROLOGIC: Cranial nerves II through XII are intact. No focal Motor or sensory deficits appreciated b/l. Globally weak & bedbound PSYCHIATRIC: The patient is alert and oriented x 1.  SKIN: No obvious rash, lesion, or ulcer.   LABORATORY PANEL:   CBC  Recent Labs Lab 07/21/16 0929  WBC 5.4  HGB 13.4  HCT 39.6  PLT 353   ------------------------------------------------------------------------------------------------------------------  Chemistries   Recent Labs Lab 07/21/16 1038  NA 142  K 3.4*  CL 111  CO2 26  GLUCOSE 94  BUN 19  CREATININE 0.37*  CALCIUM 8.8*  AST 18  ALT 7*  ALKPHOS 70  BILITOT 0.5   ------------------------------------------------------------------------------------------------------------------  Cardiac Enzymes No results for input(s): TROPONINI in the last 168 hours. ------------------------------------------------------------------------------------------------------------------  RADIOLOGY:  Ct Angio Aortobifemoral W  And/or Wo Contrast  Result Date: 07/21/2016 CLINICAL DATA:  Necrotic right foot. EXAM: CT ANGIOGRAPHY OF ABDOMINAL AORTA WITH ILIOFEMORAL RUNOFF TECHNIQUE: Multidetector CT imaging of the abdomen, pelvis and lower extremities was performed using the standard protocol during bolus administration of intravenous contrast. Multiplanar CT image reconstructions and MIPs were obtained to evaluate the vascular anatomy. CONTRAST:  100 mL of Isovue 370 intravenously. COMPARISON:  None. FINDINGS: Vascular Diffuse atherosclerosis of abdominal aorta and all visualized vasculature is noted. There is no evidence of abdominal aortic aneurysm or dissection. Mesenteric and renal arteries are widely patent without significant stenosis. Iliac arteries are heavily calcified, but no significant stenosis is noted. Common femoral arteries are widely patent. Severe atheromatous disease is noted throughout the right superficial femoral artery resulting in multifocal stenoses and occlusion in its distal portion. The right popliteal artery is occluded. Collaterals are seen leading to reconstitution of the proximal portions of the anterior tibial and peroneal arteries, although these are seen to occlude in the mid calf. Collaterals are seen leading to reconstitution of the distal portion the anterior tibial artery and dorsalis pedis artery on the right. Severe atheromatous  disease is seen involving the middle and distal portion of the left superficial from artery, resulting in multifocal stenoses and occlusion of its distal portion. Left popliteal artery is occluded. Collaterals are seen leading to reconstitution of the proximal portions of the anterior tibial and peroneal arteries. The left anterior tibial artery is seen to occlude proximally. Left peroneal artery is seen flowing toward the ankle mortise. Non Vascular Visualized lung bases are unremarkable. No definite abnormality is noted in the liver, spleen or pancreas. Adrenal glands and  kidneys appear normal. No gallstones are noted. There is no evidence of bowel obstruction. No abnormal fluid collection is noted. Urinary bladder appears normal. Mild multilevel degenerative disc disease is noted in the lower lumbar spine. IMPRESSION: Diffuse atherosclerosis of abdominal aorta and all visualized vasculature is noted. There is no evidence of abdominal aortic aneurysm or dissection. Severe atheromatous disease is seen involving the middle and distal portions of both superficial femoral arteries, resulting in multifocal stenoses and complete occlusion of there distal portions. Both popliteal arteries are occluded. Collaterals are seen leading to reconstitution of the proximal portions of the right anterior tibial and peroneal arteries, both of which are seen to occlude in the mid calf. Reconstitution of the most distal portion of the right anterior tibial artery and dorsalis pedis artery is noted through collaterals. Occlusion of the right posterior tibial artery is noted. Collaterals are seen leading to reconstitution of the proximal portions of the left anterior tibial and peroneal arteries. The left anterior tibial artery is seen to occlude proximally. The left peritoneal artery is seen flowing continuously toward the ankle mortise. Electronically Signed   By: Marijo Conception, M.D.   On: 07/21/2016 13:07  Dg Foot Complete Right  Result Date: 07/21/2016 CLINICAL DATA:  Right foot necrosis and pain.  Initial encounter. EXAM: RIGHT FOOT COMPLETE - 3+ VIEW COMPARISON:  None. FINDINGS: No acute bony or joint abnormality is identified. No soft tissue gas collection is seen. Mild hallux valgus deformity and first MTP osteoarthritis are seen. No radiopaque foreign body is identified. No bony destructive change or periosteal reaction is identified. Atherosclerotic vascular disease is noted. IMPRESSION: No acute abnormality. 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 Atherosclerosis. Electronically  Signed   By: Inge Rise M.D.   On: 07/21/2016 09:36    IMPRESSION AND PLAN:   74 year old female with past medical history of dementia, coronary artery disease, history of previous CVA, seizures, glaucoma, essential hypertension, who presents to the hospital due to significant right lower extremity pain.  1. Right lower extreme pain/necrotic toes-patient's CT angiogram done in the ER showing significant vascular disease. The ER physician spoke to Dr. Delana Meyer on call who recommended a possible above-the-knee amputation, but when this was discussed with the patient's POA (Granddaughter) she does not want it presently. -For now empirically I will treat her with IV antibiotics with vancomycin, Zosyn. ?? Need for heparin gtt and will need to discuss w/ Vascular surgery. -I will get a vascular surgery and podiatry consult. Patient is already followed by hospice at home and once the granddaughter decides which way to proceed we will decide on management. - cont. Supportive care for now.  Pain control w/ PRN Morphine.  - Palliative Care consult to discuss goals of care.  Pt. Is Already a DNR  2. Essential hypertension-continue hydrochlorothiazide.  3. History of dementia-continue Depakote.  4. History of coronary artery disease-clinically presently stable and not having any acute chest pain.    All the records  are reviewed and case discussed with ED provider. Management plans discussed with the patient, family and they are in agreement.  CODE STATUS: DO NOT RESUSCITATE  TOTAL TIME TAKING CARE OF THIS PATIENT: 50 minutes.    Henreitta Leber M.D on 07/21/2016 at 3:29 PM  Between 7am to 6pm - Pager - [phone]  After 6pm go to [email or web address] - password [email or web address]  Office  [phone]  CC: Primary care physician; Sabino Snipes KEY, MD

## 2016-07-21 NOTE — ED Notes (Signed)
Lab called to state green top tube was hemolyzed.

## 2016-07-21 NOTE — Progress Notes (Signed)
Pharmacy Antibiotic Note  Debra Hoffman is a 75 y.o. female admitted on 07/21/2016 with wound infection/ necrotic toes on right foot.  Pharmacy has been consulted for Vancomycin and Zosyn dosing.  Plan: Ke: 0.047   T1/2: 14.7   Vd: 36   Scr: 0.8  Patient will get Vancomycin 1gm IV x1 followed by a regimen of vancomycin 750mg  every 18 hours with 12 hour stack dosing. Calculated trough at Css is 17. Trough ordered prior to 4th regimen dose.  Will plan to monitor Scr and renal function and adjust dose as needed.  Will start the patient on Zosyn 3.375mg  IV EI every 8 hours.   Height: 5\' 5"  (165.1 cm) Weight: 114 lb 8 oz (51.9 kg) IBW/kg (Calculated) : 57  Temp (24hrs), Avg:97.6 F (36.4 C), Min:97.4 F (36.3 C), Max:97.7 F (36.5 C)   Recent Labs Lab 07/21/16 0929 07/21/16 1038  WBC 5.4  --   CREATININE  --  0.37*    Estimated Creatinine Clearance: 51.3 mL/min (by C-G formula based on SCr of 0.8 mg/dL).    Allergies  Allergen Reactions  . Shellfish Allergy     Reaction: unknown    Antimicrobials this admission: 7/28 Vancomycin  >>  7/28 Zosyn  >>   Dose adjustments this admission: N/A  Microbiology results: BCx:  Thank you for allowing pharmacy to be a part of this patient's care.  Nancy Fetter, PharmD Clinical Pharmacist 07/21/2016 5:06 PM

## 2016-07-22 DIAGNOSIS — E43 Unspecified severe protein-calorie malnutrition: Secondary | ICD-10-CM | POA: Insufficient documentation

## 2016-07-22 LAB — CBC
HCT: 35.6 % (ref 35.0–47.0)
HEMOGLOBIN: 11.8 g/dL — AB (ref 12.0–16.0)
MCH: 27.8 pg (ref 26.0–34.0)
MCHC: 33.1 g/dL (ref 32.0–36.0)
MCV: 84 fL (ref 80.0–100.0)
PLATELETS: 291 10*3/uL (ref 150–440)
RBC: 4.24 MIL/uL (ref 3.80–5.20)
RDW: 14.8 % — AB (ref 11.5–14.5)
WBC: 6.2 10*3/uL (ref 3.6–11.0)

## 2016-07-22 LAB — BASIC METABOLIC PANEL
ANION GAP: 6 (ref 5–15)
BUN: 17 mg/dL (ref 6–20)
CALCIUM: 8.4 mg/dL — AB (ref 8.9–10.3)
CO2: 28 mmol/L (ref 22–32)
CREATININE: 0.47 mg/dL (ref 0.44–1.00)
Chloride: 109 mmol/L (ref 101–111)
GFR calc Af Amer: >60 mL/min (ref >60)
GLUCOSE: 87 mg/dL (ref 65–99)
Potassium: 3.2 mmol/L — ABNORMAL LOW (ref 3.5–5.1)
Sodium: 143 mmol/L (ref 135–145)

## 2016-07-22 LAB — HEPARIN LEVEL (UNFRACTIONATED)
HEPARIN UNFRACTIONATED: 0.13 [IU]/mL — AB (ref 0.30–0.70)
HEPARIN UNFRACTIONATED: 0.32 [IU]/mL (ref 0.30–0.70)

## 2016-07-22 LAB — MAGNESIUM: Magnesium: 2 mg/dL (ref 1.7–2.4)

## 2016-07-22 LAB — ABO/RH: ABO/RH(D): A POS

## 2016-07-22 LAB — PHOSPHORUS: Phosphorus: 2 mg/dL — ABNORMAL LOW (ref 2.5–4.6)

## 2016-07-22 MED ORDER — HEPARIN BOLUS VIA INFUSION
1500.0000 [IU] | Freq: Once | INTRAVENOUS | Status: AC
  Administered 2016-07-22: 1500 [IU] via INTRAVENOUS
  Filled 2016-07-22: qty 1500

## 2016-07-22 MED ORDER — HEPARIN (PORCINE) IN NACL 100-0.45 UNIT/ML-% IJ SOLN
1000.0000 [IU]/h | INTRAMUSCULAR | Status: DC
  Administered 2016-07-22 (×2): 1000 [IU]/h via INTRAVENOUS
  Filled 2016-07-22 (×3): qty 250

## 2016-07-22 MED ORDER — CEFAZOLIN SODIUM-DEXTROSE 2-4 GM/100ML-% IV SOLN
2.0000 g | INTRAVENOUS | Status: AC
  Administered 2016-07-23: 2 g via INTRAVENOUS
  Filled 2016-07-22: qty 100

## 2016-07-22 MED ORDER — PNEUMOCOCCAL VAC POLYVALENT 25 MCG/0.5ML IJ INJ: 0.5000 mL | INJECTION | INTRAMUSCULAR | Status: DC

## 2016-07-22 MED ORDER — ENSURE ENLIVE PO LIQD
237.0000 mL | Freq: Two times a day (BID) | ORAL | Status: DC
  Administered 2016-07-22 – 2016-07-24 (×3): 237 mL via ORAL

## 2016-07-22 MED ORDER — POTASSIUM CHLORIDE 20 MEQ PO PACK
40.0000 meq | PACK | Freq: Two times a day (BID) | ORAL | Status: AC
  Administered 2016-07-22 (×2): 40 meq via ORAL
  Filled 2016-07-22 (×2): qty 2

## 2016-07-22 NOTE — Progress Notes (Signed)
Patient ID: Anslee Bobek, female   DOB: 17-Apr-1941, 75 y.o.   MRN: [id number] I was able to have a discussion this morning with the patient's granddaughter who is the power of attorney. Did not reassess the patient's foot as there was no real need to perform this today. The granddaughter's concern is that her grandmother might not survive any major surgery such as an above-knee amputation on her lower extremity. 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 Discussed with the granddaughter, and it was my recommendation, that she considering consenting to the above-knee amputation with the hopes of eradicating the infection and gangrene in her foot and in the hopes of surviving the operation. If not the eventual outcome would be the same as sending her home on hospice. The granddaughter states that she is leaning towards consenting to the amputation. Talked with the nurse and will contact Dr. Delana Meyer to begin the process of considering an above-knee amputation.
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
## 2016-07-22 NOTE — Care Management Important Message (Signed)
Important Message  Patient Details  Name: Natasia Heroux MRN: [id number] Date of Birth: 05-17-1941   Medicare Important Message Given:  Yes    Carles Collet, RN 07/22/2016, 9:07 AM
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
## 2016-07-22 NOTE — Progress Notes (Signed)
ANTICOAGULATION CONSULT NOTE - Initial Consult  Pharmacy Consult for heparin Indication: PAD  Allergies  Allergen Reactions  . Shellfish Allergy     Reaction: unknown    Patient Measurements: Height: 5\' 5"  (165.1 cm) Weight: 114 lb 8 oz (51.9 kg) IBW/kg (Calculated) : 57  Vital Signs: Temp: 97.7 F (36.5 C) (07/29 0603) Temp Source: Oral (07/29 0603) BP: 138/97 (07/29 0603) Pulse Rate: 80 (07/29 0603)  Recent Labs  07/21/16 0929 07/21/16 1038 07/21/16 2058 07/22/16 0517  HGB 13.4  --   --  11.8*  HCT 39.6  --   --  35.6  PLT 353  --   --  291  APTT  --   --  36  --   LABPROT  --   --  14.9  --   INR  --   --  1.16  --   HEPARINUNFRC  --   --   --  0.13*  CREATININE  --  0.37*  --  0.47    Estimated Creatinine Clearance: 51.3 mL/min (by C-G formula based on SCr of 0.8 mg/dL).   Medical History: Past Medical History:  Diagnosis Date  . Cervicalgia   . Constipation   . Coronary artery disease   . Dementia without behavioral disturbance   . Diabetes mellitus without complication ([place])   . Dysuria   . Glaucoma   . Hypertension   . Insomnia   . Memory loss   . Pressure ulcer of foot, stage 2   . Seizures ([place])   . Stroke ([place])   . Urinary incontinence   . Wheelchair dependence     Assessment: 75 yo female with necrotic wounds on right foot toes. Pharmacy consulted for heparin dosing and monitoring for PAD    Goal of Therapy:  Heparin level 0.3-0.7 units/ml Monitor platelets by anticoagulation protocol: Yes   Plan:  Give 3000 units bolus x 1 Start heparin infusion at 800 units/hr Check anti-Xa level in 8 hours and daily while on heparin Continue to monitor H&H and platelets   7/29: Heparin level at 0517= 0.13.  Will give 1500 units bolus and increase drip to 1000 units/hr. Recheck Heparin level in 8 hrs.  Chinita Greenland PharmD Clinical Pharmacist 07/22/2016 8:15 AM

## 2016-07-22 NOTE — Progress Notes (Addendum)
ANTICOAGULATION CONSULT NOTE - Initial Consult  Pharmacy Consult for heparin Indication: PAD  Allergies  Allergen Reactions  . Shellfish Allergy     Reaction: unknown    Patient Measurements: Height: 5\' 5"  (165.1 cm) Weight: 114 lb 8 oz (51.9 kg) IBW/kg (Calculated) : 57  Vital Signs: Temp: 97.6 F (36.4 C) (07/29 1320) Temp Source: Oral (07/29 1320) BP: 141/72 (07/29 1320) Pulse Rate: 34 (07/29 1320)  Recent Labs  07/21/16 0929 07/21/16 1038 07/21/16 2058 07/22/16 0517 07/22/16 1623  HGB 13.4  --   --  11.8*  --   HCT 39.6  --   --  35.6  --   PLT 353  --   --  291  --   APTT  --   --  36  --   --   LABPROT  --   --  14.9  --   --   INR  --   --  1.16  --   --   HEPARINUNFRC  --   --   --  0.13* 0.32  CREATININE  --  0.37*  --  0.47  --     Estimated Creatinine Clearance: 51.3 mL/min (by C-G formula based on SCr of 0.8 mg/dL).   Medical History: Past Medical History:  Diagnosis Date  . Cervicalgia   . Constipation   . Coronary artery disease   . Dementia without behavioral disturbance   . Diabetes mellitus without complication ([place])   . Dysuria   . Glaucoma   . Hypertension   . Insomnia   . Memory loss   . Pressure ulcer of foot, stage 2   . Seizures ([place])   . Stroke ([place])   . Urinary incontinence   . Wheelchair dependence     Assessment: 75 yo female with necrotic wounds on right foot toes. Pharmacy consulted for heparin dosing and monitoring for PAD    Goal of Therapy:  Heparin level 0.3-0.7 units/ml Monitor platelets by anticoagulation protocol: Yes   Plan:  7/29 HL @ 1623 = 0.32 (therapeutic). Will continue heparin drip at current rate of 1000 units/hr. Confirmatory heparin level ordered in 8 hours. CBC with AM labs tomorrow.   7/30 0100 Confirmatory heparin level 0.45. Continue current regimen. Recheck with CBC tomorrow AM.  Lenis Noon PharmD Clinical Pharmacist 07/22/2016 5:17 PM

## 2016-07-22 NOTE — Progress Notes (Signed)
[facility]  Daily Progress Note   Subjective: Patient seen and examined with granddaughter who is power of attorney at bedside. She is now consenting to an above the knee amputation. This is tentatively planned for tomorrow. Procedure, risks and benefits explained to patient and granddaughter. All questions answered. Granddaughter wants patient to go home after surgery not to rehab and back into hospice care.   Objective: Vitals:   07/21/16 1530 07/21/16 1636 07/21/16 2047 07/22/16 0603  BP: (!) 153/89 (!) 161/91 (!) 155/87 (!) 138/97  Pulse:  (!) 53 (!) 115 80  Resp: 14 16 20 20   Temp:  97.4 F (36.3 C) 97.8 F (36.6 C) 97.7 F (36.5 C)  TempSrc:  Axillary Axillary Oral  SpO2:  99% (!) 73% 100%  Weight:      Height:        Intake/Output Summary (Last 24 hours) at 07/22/16 1316 Last data filed at 07/22/16 1028  Gross per 24 hour  Intake              392 ml  Output                0 ml  Net              392 ml   Physical Exam: Patient with dementia - baseline confused, NAD CV: RRR Pulmonary: CTA Bilaterally Abdomen: Soft, Nontender, Nondistended Vascular:  Right Lower Extremity: Dry gangrene of the first, second and third toes. No drainage noted. No cellulitis noted. No open wounds noted.   Laboratory: CBC    Component Value Date/Time   WBC 6.2 07/22/2016 0517   HGB 11.8 (L) 07/22/2016 0517   HGB 15.0 03/21/2015 1130   HCT 35.6 07/22/2016 0517   HCT 45.7 03/21/2015 1130   PLT 291 07/22/2016 0517   PLT 206 03/21/2015 1130   BMET    Component Value Date/Time   NA 143 07/22/2016 0517   NA 139 03/21/2015 1130   K 3.2 (L) 07/22/2016 0517   K 3.7 03/21/2015 1130   CL 109 07/22/2016 0517   CL 102 03/21/2015 1130   CO2 28 07/22/2016 0517   CO2 29 03/21/2015 1130   GLUCOSE 87 07/22/2016 0517   GLUCOSE 184 (H) 03/21/2015 1130   BUN 17 07/22/2016 0517   BUN 19 03/21/2015 1130   CREATININE 0.47 07/22/2016 0517   CREATININE 1.02 (H) 03/21/2015  1130   CALCIUM 8.4 (L) 07/22/2016 0517   CALCIUM 9.5 03/21/2015 1130   GFRNONAA >60 07/22/2016 0517   GFRNONAA 55 (L) 03/21/2015 1130   GFRAA >60 07/22/2016 0517   GFRAA >60 03/21/2015 1130   Assessment/Planning: 75 year old female with known history of dementia, history of seizures, diabetes, hypertension, coronary artery disease, history of previous CVA who presents to the hospital from home hospice due right lower extremity pain and gangrene of her right first, second and third toes. 1) Granddaughter has consented for right AKA. This is tentatively scheduled for tomorrow with Dr. Eber Hong Stegmayer PA-C 07/22/2016 1:16 PM

## 2016-07-22 NOTE — Progress Notes (Signed)
Initial Nutrition Assessment  DOCUMENTATION CODES:   Severe malnutrition in context of chronic illness  INTERVENTION:  1.  General healthful diet; continue to offer meals and supplements as patient will accept.  Nursing and tech staff have been assisting with feeds.  Patient tolerating well. 2.  Labs; Monitor magnesium, potassium, and phosphorus daily for at least 3 days, MD/pharmacy to replete as needed, as pt is at risk for refeeding syndrome given dx of severe malnutrition with concern for recent intake.  MD paged with order request.  2.  Supplements; will add Ensure Enlive.  If patient with difficulty chewing/swallowing, will also place orders for Magic cup daily.  Thus far, has tolerated Regular/thin textures well despite no teeth or dentures.   3.  Goals of care; RD notes patient is home with hospice which may not be adequate level of care at this time. Team considering hospice facility.     NUTRITION DIAGNOSIS:   Malnutrition related to lethargy/confusion, social / environmental circumstances as evidenced by severe depletion of body fat, severe depletion of muscle mass.  GOAL:   Patient will meet greater than or equal to 90% of their needs, Weight gain  MONITOR:   PO intake, Supplement acceptance, Weight trends  REASON FOR ASSESSMENT:   Malnutrition Screening Tool    ASSESSMENT:   RD drawn to chart via MST screening  Patient resting comfortably at time of visit.  Opens eyes to voice and touch but does not engage in conversation.  Patient with history of dementia, history of seizures, alcohol, diabetes, hypertension, coronary artery disease, history of previous CVA.  Patient admitted from home due to pain in her right lower extremity and necrotic-looking right toes with maggot infestation.  Discussed current status with RN who reports patient has been eating well for staff since admission last night. Per RN, hospice dx is FTT.  Patient is edentulous, no word on whether  patient has fitting dentures.  She has been tolerating regular textures with thin liquids, but would benefit from chopped/soft foods.   Nutrition focused physical exam performed.  Patient with severe muscle and fat wasting and meet criteria for severe malnutrition of chronic illness.   Wt Readings from Last 3 Encounters:  07/21/16 114 lb 8 oz (51.9 kg)  10/27/15 158 lb 4.8 oz (71.8 kg)  07/22/15 210 lb (95.3 kg)   Review of weight history shows severe weight loss if accurate. Highest recorded weight is 210 lbs in 07/22/15 and most recent weight range of 145-155 lbs appears well documented as of Nov 2016 representing a 26% decrease in body weight in the past 9 months.   Will monitor for appropriate interventions as well as goals of care.   Diet Order:  Diet Heart Room service appropriate? Yes; Fluid consistency: Thin  Skin:  Reviewed, no issues  Last BM:  7/28  Height:   Ht Readings from Last 1 Encounters:  07/21/16 5\' 5"  (1.651 m)    Weight:   Wt Readings from Last 1 Encounters:  07/21/16 114 lb 8 oz (51.9 kg)    Ideal Body Weight:     BMI:  Body mass index is 19.05 kg/m.  Estimated Nutritional Needs:   Kcal:  1500-1600  Protein:  65-75g  Fluid:  >1.5 L/day  EDUCATION NEEDS:   Education needs no appropriate at this time  Debra Greathouse, MS RD LDN Clinical Inpatient Dietitian Weekend/After hours pager: [phone]

## 2016-07-23 ENCOUNTER — Encounter
Admission: EM | Disposition: A | Discharge status: Skilled Nursing Facility | Payer: Self-pay | Source: Home / Self Care | Attending: Internal Medicine

## 2016-07-23 ENCOUNTER — Inpatient Hospital Stay: Payer: Medicare Other | Admitting: Anesthesiology

## 2016-07-23 HISTORY — PX: AMPUTATION: SHX166

## 2016-07-23 LAB — CBC
HEMATOCRIT: 32.3 % — AB (ref 35.0–47.0)
Hemoglobin: 11.1 g/dL — ABNORMAL LOW (ref 12.0–16.0)
MCH: 28.6 pg (ref 26.0–34.0)
MCHC: 34.4 g/dL (ref 32.0–36.0)
MCV: 83.2 fL (ref 80.0–100.0)
PLATELETS: 278 10*3/uL (ref 150–440)
RBC: 3.88 MIL/uL (ref 3.80–5.20)
RDW: 14.8 % — AB (ref 11.5–14.5)
WBC: 9 10*3/uL (ref 3.6–11.0)

## 2016-07-23 LAB — BASIC METABOLIC PANEL
ANION GAP: 6 (ref 5–15)
BUN: 18 mg/dL (ref 6–20)
CALCIUM: 8.3 mg/dL — AB (ref 8.9–10.3)
CO2: 25 mmol/L (ref 22–32)
CREATININE: 0.43 mg/dL — AB (ref 0.44–1.00)
Chloride: 110 mmol/L (ref 101–111)
GFR calc Af Amer: >60 mL/min (ref >60)
GLUCOSE: 189 mg/dL — AB (ref 65–99)
Potassium: 4.2 mmol/L (ref 3.5–5.1)
Sodium: 141 mmol/L (ref 135–145)

## 2016-07-23 LAB — PROTIME-INR
INR: 1.21
PROTHROMBIN TIME: 15.4 s — AB (ref 11.4–15.2)

## 2016-07-23 LAB — MAGNESIUM: Magnesium: 1.8 mg/dL (ref 1.7–2.4)

## 2016-07-23 LAB — SURGICAL PCR SCREEN
MRSA, PCR: NEGATIVE
STAPHYLOCOCCUS AUREUS: NEGATIVE

## 2016-07-23 LAB — HEPARIN LEVEL (UNFRACTIONATED): HEPARIN UNFRACTIONATED: 0.45 [IU]/mL (ref 0.30–0.70)

## 2016-07-23 LAB — APTT: aPTT: 119 seconds — ABNORMAL HIGH (ref 24–36)

## 2016-07-23 LAB — PHOSPHORUS: Phosphorus: <1 mg/dL — CL (ref 2.5–4.6)

## 2016-07-23 SURGERY — AMPUTATION, ABOVE KNEE
Anesthesia: General | Laterality: Right

## 2016-07-23 MED ORDER — PHENYLEPHRINE HCL 10 MG/ML IJ SOLN
INTRAMUSCULAR | Status: DC | PRN
  Administered 2016-07-23 (×5): 100 ug via INTRAVENOUS

## 2016-07-23 MED ORDER — LACTATED RINGERS IV SOLN
INTRAVENOUS | Status: DC | PRN
  Administered 2016-07-23: 09:00:00 via INTRAVENOUS

## 2016-07-23 MED ORDER — ONDANSETRON HCL 4 MG/2ML IJ SOLN: 4.0000 mg | Freq: Once | INTRAMUSCULAR | Status: DC | PRN

## 2016-07-23 MED ORDER — EPHEDRINE SULFATE 50 MG/ML IJ SOLN
INTRAMUSCULAR | Status: DC | PRN
  Administered 2016-07-23 (×2): 10 mg via INTRAVENOUS

## 2016-07-23 MED ORDER — HYDROCODONE-ACETAMINOPHEN 5-325 MG PO TABS
1.0000 | ORAL_TABLET | ORAL | Status: DC | PRN
  Administered 2016-07-23 – 2016-07-25 (×5): 2 via ORAL
  Filled 2016-07-23 (×5): qty 2

## 2016-07-23 MED ORDER — CEFAZOLIN IN D5W 1 GM/50ML IV SOLN
1.0000 g | Freq: Three times a day (TID) | INTRAVENOUS | Status: AC
  Administered 2016-07-23 (×2): 1 g via INTRAVENOUS
  Filled 2016-07-23 (×3): qty 50

## 2016-07-23 MED ORDER — FENTANYL CITRATE (PF) 100 MCG/2ML IJ SOLN
INTRAMUSCULAR | Status: DC | PRN
  Administered 2016-07-23 (×8): 25 ug via INTRAVENOUS

## 2016-07-23 MED ORDER — ENOXAPARIN SODIUM 40 MG/0.4ML ~~LOC~~ SOLN
40.0000 mg | SUBCUTANEOUS | Status: DC
  Administered 2016-07-23: 40 mg via SUBCUTANEOUS
  Filled 2016-07-23 (×3): qty 0.4

## 2016-07-23 MED ORDER — SODIUM CHLORIDE 0.9 % IV SOLN
INTRAVENOUS | Status: DC
  Administered 2016-07-23 – 2016-07-26 (×7): via INTRAVENOUS

## 2016-07-23 MED ORDER — POTASSIUM & SODIUM PHOSPHATES 280-160-250 MG PO PACK
1.0000 | PACK | Freq: Three times a day (TID) | ORAL | Status: DC
  Administered 2016-07-23 – 2016-07-26 (×10): 1 via ORAL
  Filled 2016-07-23 (×10): qty 1

## 2016-07-23 MED ORDER — FENTANYL CITRATE (PF) 100 MCG/2ML IJ SOLN: 25.0000 ug | INTRAMUSCULAR | Status: DC | PRN

## 2016-07-23 MED ORDER — PROPOFOL 10 MG/ML IV BOLUS
INTRAVENOUS | Status: DC | PRN
  Administered 2016-07-23: 60 mg via INTRAVENOUS

## 2016-07-23 SURGICAL SUPPLY — 43 items
BAG COUNTER SPONGE EZ (MISCELLANEOUS) ×2 IMPLANT
BANDAGE ELASTIC 6 CLIP NS LF (GAUZE/BANDAGES/DRESSINGS) ×2 IMPLANT
BANDAGE ELASTIC 6 LF NS (GAUZE/BANDAGES/DRESSINGS) ×2 IMPLANT
BLADE SAW GIGLI 510 (BLADE) IMPLANT
BLADE SAW SAG 25.4X90 (BLADE) ×2 IMPLANT
BLADE SURG SZ10 CARB STEEL (BLADE) ×2 IMPLANT
BNDG COHESIVE 4X5 TAN STRL (GAUZE/BANDAGES/DRESSINGS) ×2 IMPLANT
BNDG GAUZE 4.5X4.1 6PLY STRL (MISCELLANEOUS) ×2 IMPLANT
CANISTER SUCT 1200ML W/VALVE (MISCELLANEOUS) ×2 IMPLANT
CHLORAPREP W/TINT 26ML (MISCELLANEOUS) ×2 IMPLANT
DRAPE INCISE IOBAN 66X45 STRL (DRAPES) ×2 IMPLANT
ELECT CAUTERY BLADE 6.4 (BLADE) ×2 IMPLANT
ELECT REM PT RETURN 9FT ADLT (ELECTROSURGICAL) ×2
ELECTRODE REM PT RTRN 9FT ADLT (ELECTROSURGICAL) ×1 IMPLANT
GAUZE FLUFF 18X24 1PLY STRL (GAUZE/BANDAGES/DRESSINGS) ×2 IMPLANT
GAUZE PETRO XEROFOAM 1X8 (MISCELLANEOUS) ×2 IMPLANT
GAUZE XEROFORM 4X4 STRL (GAUZE/BANDAGES/DRESSINGS) ×2 IMPLANT
GLOVE BIO SURGEON STRL SZ7 (GLOVE) ×8 IMPLANT
GLOVE INDICATOR 7.5 STRL GRN (GLOVE) ×2 IMPLANT
GLOVE SURG SYN 8.0 (GLOVE) ×2 IMPLANT
GOWN STRL REUS W/ TWL LRG LVL3 (GOWN DISPOSABLE) ×2 IMPLANT
GOWN STRL REUS W/ TWL XL LVL3 (GOWN DISPOSABLE) ×1 IMPLANT
GOWN STRL REUS W/TWL LRG LVL3 (GOWN DISPOSABLE) ×2
GOWN STRL REUS W/TWL XL LVL3 (GOWN DISPOSABLE) ×1
HANDLE YANKAUER SUCT BULB TIP (MISCELLANEOUS) ×2 IMPLANT
KIT RM TURNOVER STRD PROC AR (KITS) ×2 IMPLANT
NS IRRIG 500ML POUR BTL (IV SOLUTION) ×2 IMPLANT
PACK EXTREMITY ARMC (MISCELLANEOUS) ×2 IMPLANT
PAD ABD DERMACEA PRESS 5X9 (GAUZE/BANDAGES/DRESSINGS) ×4 IMPLANT
PAD PREP 24X41 OB/GYN DISP (PERSONAL CARE ITEMS) ×2 IMPLANT
SPONGE LAP 18X18 5 PK (GAUZE/BANDAGES/DRESSINGS) ×4 IMPLANT
STAPLER SKIN PROX 35W (STAPLE) ×2 IMPLANT
STOCKINETTE M/LG 89821 (MISCELLANEOUS) ×2 IMPLANT
SUT ETHIBOND 0 36 GRN (SUTURE) IMPLANT
SUT MNCRL 3 0 RB1 (SUTURE) ×1 IMPLANT
SUT MONOCRYL 3 0 RB1 (SUTURE) ×1
SUT SILK 2 0 (SUTURE)
SUT SILK 2-0 18XBRD TIE 12 (SUTURE) IMPLANT
SUT VIC AB 0 CT1 36 (SUTURE) ×10 IMPLANT
SUT VIC AB 3-0 SH 27 (SUTURE) ×2
SUT VIC AB 3-0 SH 27X BRD (SUTURE) ×2 IMPLANT
SUT VICRYL PLUS ABS 0 54 (SUTURE) ×2 IMPLANT
TAPE UMBIL 1/8X18 RADIOPA (MISCELLANEOUS) ×2 IMPLANT
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
## 2016-07-23 NOTE — Anesthesia Preprocedure Evaluation (Signed)
Anesthesia Evaluation  Patient identified by MRN, date of birth, ID band Patient awake    Reviewed: Allergy & Precautions, H&P , NPO status , Patient's Chart, lab work & pertinent test results, reviewed documented beta blocker date and time   Airway Mallampati: II  TM Distance: >3 FB Neck ROM: full    Dental  (+) Teeth Intact   Pulmonary neg pulmonary ROS, pneumonia, resolved,    Pulmonary exam normal        Cardiovascular Exercise Tolerance: Good hypertension, + CAD  negative cardio ROS Normal cardiovascular exam Rhythm:regular Rate:Normal     Neuro/Psych Seizures -,  PSYCHIATRIC DISORDERS CVA negative neurological ROS  negative psych ROS   GI/Hepatic negative GI ROS, Neg liver ROS,   Endo/Other  negative endocrine ROSdiabetes  Renal/GU ESRFRenal diseasenegative Renal ROS  negative genitourinary   Musculoskeletal   Abdominal   Peds  Hematology negative hematology ROS (+)   Anesthesia Other Findings Past Medical History: No date: Cervicalgia No date: Constipation No date: Coronary artery disease No date: Dementia without behavioral disturbance No date: Diabetes mellitus without complication (HCC) No date: Dysuria No date: Glaucoma No date: Hypertension No date: Insomnia No date: Memory loss No date: Pressure ulcer of foot, stage 2 No date: Seizures ([place]) No date: Stroke [facility]) No date: Urinary incontinence No date: Wheelchair dependence Past Surgical History: No date: CARDIAC SURGERY     Comment: coronary artery bypass graft No date: CARDIAC SURGERY No date: CORONARY ARTERY BYPASS GRAFT BMI    Body Mass Index:  19.05 kg/m     Reproductive/Obstetrics negative OB ROS                             Anesthesia Physical Anesthesia Plan  ASA: IV and emergent  Anesthesia Plan: General ETT   Post-op Pain Management:    Induction:   Airway Management Planned:   Additional  Equipment:   Intra-op Plan:   Post-operative Plan:   Informed Consent: I have reviewed the patients History and Physical, chart, labs and discussed the procedure including the risks, benefits and alternatives for the proposed anesthesia with the patient or authorized representative who has indicated his/her understanding and acceptance.   Dental Advisory Given  Plan Discussed with: CRNA  Anesthesia Plan Comments:         Anesthesia Quick Evaluation

## 2016-07-23 NOTE — Anesthesia Postprocedure Evaluation (Signed)
Anesthesia Post Note  Patient: Debra Hoffman  Procedure(s) Performed: Procedure(s) (LRB): AMPUTATION ABOVE KNEE (Right)  Patient location during evaluation: PACU Anesthesia Type: General Level of consciousness: awake and alert Pain management: pain level controlled Vital Signs Assessment: post-procedure vital signs reviewed and stable Respiratory status: spontaneous breathing, nonlabored ventilation, respiratory function stable and patient connected to nasal cannula oxygen Cardiovascular status: blood pressure returned to baseline and stable Postop Assessment: no signs of nausea or vomiting Anesthetic complications: no    Last Vitals:  Vitals:   07/23/16 1233 07/23/16 1345  BP: (!) 103/58 109/64  Pulse: 91 92  Resp: 16 16  Temp: 36.9 C 36.9 C    Last Pain:  Vitals:   07/23/16 1345  TempSrc: Axillary                 Molli Barrows

## 2016-07-23 NOTE — Op Note (Signed)
  [facility]   OPERATIVE NOTE   PROCEDURE: right above-the-knee amputation  PRE-OPERATIVE DIAGNOSIS: right foot gangrene  POST-OPERATIVE DIAGNOSIS: same as above  SURGEON:  Leotis Pain, MD  ASSISTANT(S): Ms. Hezzie Bump  ANESTHESIA: general  ESTIMATED BLOOD LOSS: 200 cc  FINDING(S): Muscle bellies and skin appear healthy  SPECIMEN(S):  right above-the-knee amputation  INDICATIONS:   Debra Hoffman is a 75 y.o. female who presents with right foot gangrene.  The patient is scheduled for a right above-the-knee amputation.  I discussed in depth with the patient the risks, benefits, and alternatives to this procedure.  The patient is aware that the risk of this operation included but are not limited to:  bleeding, infection, myocardial infarction, stroke, death, failure to heal amputation wound, and possible need for more proximal amputation.  The patient is aware of the risks and agrees proceed forward with the procedure.  DESCRIPTION: After full informed written consent was obtained from the patient, the patient was taken to the operating room, and placed supine upon the operating table.  Prior to induction, the patient received IV antibiotics.  The patient was then prepped and draped in the standard fashion for an above-the-knee amputation.  After obtaining adequate anesthesia, the patient was prepped and draped in the standard fashion for a above-the-knee amputation.  I marked out the anterior and posterior flaps for a fish-mouth type of amputation.  I made the incisions for these flaps, and then dissected through the subcutaneous tissue, fascia, and muscles circumferentially.  I elevated  the periosteal tissue 4-5 cm more proximal than the anterior skin flap.  I then transected the femur with a power saw at this level.  Then I smoothed out the rough edges of the bone.  At this point, the specimen was passed off the field as the above-the-knee amputation.  At  this point, I clamped all visibly bleeding arteries and veins using a combination of suture ligation with silk suture and electrocautery.   Bleeding continued to be controlled with electrocautery and suture ligature.  The stump was washed off with sterile normal saline and no further active bleeding was noted.  I reapproximated the anterior and posterior fascia  with interrupted stitches of 0 Vicryl.  This was completed along the entire length of anterior and posterior fascia until there were no more loose space in the fascial line. The subcutaneous tissue was then approximated with 2-0 vicryl sutures. The skin was then  reapproximated with staples.  The stump was washed off and dried.  The incision was dressed with Xeroform and ABD pads, and  then fluffs were applied.  Kerlix was wrapped around the leg and then gently an ACE wrap was applied.  A large Ioban was then placed over the ACE wrap to secure the dressing. The patient was then awakened and take to the recovery room in stable condition.   COMPLICATIONS: None  CONDITION: Stable  [name]  07/23/2016, 10:33 AM

## 2016-07-23 NOTE — Transfer of Care (Signed)
Immediate Anesthesia Transfer of Care Note  Patient: Debra Hoffman  Procedure(s) Performed: Procedure(s): AMPUTATION ABOVE KNEE (Right)  Patient Location: PACU  Anesthesia Type:General  Level of Consciousness: sedated  Airway & Oxygen Therapy: Patient Spontanous Breathing and Patient connected to face mask oxygen  Post-op Assessment: Report given to RN and Post -op Vital signs reviewed and stable  Post vital signs: Reviewed and stable  Last Vitals:  Vitals:   07/22/16 2058 07/23/16 0452  BP: 135/83 138/69  Pulse: 91 (!) 114  Resp: 17 17  Temp: 37.2 C 36.4 C    Last Pain:  Vitals:   07/23/16 0452  TempSrc: Oral         Complications: No apparent anesthesia complications

## 2016-07-23 NOTE — Anesthesia Procedure Notes (Signed)
Procedure Name: LMA Insertion Date/Time: 07/23/2016 9:10 AM Performed by: Nelda Marseille Pre-anesthesia Checklist: Patient identified, Patient being monitored, Timeout performed, Emergency Drugs available and Suction available Patient Re-evaluated:Patient Re-evaluated prior to inductionOxygen Delivery Method: Circle system utilized Preoxygenation: Pre-oxygenation with 100% oxygen Intubation Type: IV induction Ventilation: Mask ventilation without difficulty LMA: LMA inserted LMA Size: 3.5 Tube type: Oral Number of attempts: 1 Placement Confirmation: positive ETCO2 and breath sounds checked- equal and bilateral Tube secured with: Tape Dental Injury: Teeth and Oropharynx as per pre-operative assessment

## 2016-07-23 NOTE — Progress Notes (Signed)
OR nurse called and verbalized that Dr. Delana Meyer  Stated to stop heparin gtt until after surgery. Debra Hoffman

## 2016-07-23 NOTE — Progress Notes (Signed)
Clinical Social Worker (CSW) discussed case with attending MD. Patient will have surgery early next week. Disposition is undetermined at this time. CSW will continue to follow and assist as needed.   [employer], LCSW  [phone]

## 2016-07-23 NOTE — Progress Notes (Signed)
Called and spoke with Dr.Diamond regarding critical potassium level of less than 1.0. No new orders were given at this time. Debra Hoffman

## 2016-07-24 ENCOUNTER — Encounter: Payer: Self-pay | Admitting: Vascular Surgery

## 2016-07-24 LAB — CBC
HCT: 28 % — ABNORMAL LOW (ref 35.0–47.0)
HEMOGLOBIN: 9.8 g/dL — AB (ref 12.0–16.0)
MCH: 29.7 pg (ref 26.0–34.0)
MCHC: 34.9 g/dL (ref 32.0–36.0)
MCV: 85 fL (ref 80.0–100.0)
Platelets: 222 10*3/uL (ref 150–440)
RBC: 3.29 MIL/uL — AB (ref 3.80–5.20)
RDW: 14.9 % — ABNORMAL HIGH (ref 11.5–14.5)
WBC: 6.8 10*3/uL (ref 3.6–11.0)

## 2016-07-24 LAB — PHOSPHORUS: PHOSPHORUS: 2.6 mg/dL (ref 2.5–4.6)

## 2016-07-24 LAB — MAGNESIUM: MAGNESIUM: 1.7 mg/dL (ref 1.7–2.4)

## 2016-07-24 NOTE — Progress Notes (Signed)
[facility] [place] NAME: Debra Hoffman    MR#:  [id number]  DATE OF BIRTH:  06/24/1941  SUBJECTIVE:  CHIEF COMPLAINT:   Chief Complaint  Patient presents with  . Foot Pain  . Wound Infection  some pain at surgery site. REVIEW OF SYSTEMS:  Review of Systems  Unable to perform ROS: Dementia    DRUG ALLERGIES:   Allergies  Allergen Reactions  . Shellfish Allergy     Reaction: unknown   VITALS:  Blood pressure 135/73, pulse (!) 110, temperature 98.3 F (36.8 C), temperature source Oral, resp. rate 17, height 5\' 5"  (1.651 m), weight 51.9 kg (114 lb 8 oz), SpO2 100 %. PHYSICAL EXAMINATION:  Physical Exam  Constitutional: She is oriented to person, place, and time. She appears to be writhing in pain and malnourished. She appears cachectic. She has a sickly appearance. She appears distressed.  HENT:  Head: Normocephalic and atraumatic.  Eyes: Conjunctivae and EOM are normal. Pupils are equal, round, and reactive to light.  Neck: Normal range of motion. Neck supple. No tracheal deviation present. No thyromegaly present.  Cardiovascular: Normal rate, regular rhythm and normal heart sounds.   Pulmonary/Chest: Effort normal and breath sounds normal. No respiratory distress. She has no wheezes. She exhibits no tenderness.  Abdominal: Soft. Bowel sounds are normal. She exhibits no distension. There is no tenderness.  Musculoskeletal: Normal range of motion.  Neurological: She is alert and oriented to person, place, and time. No cranial nerve deficit.  Skin: Skin is warm and dry. No rash noted.  Rt AKA - dressing in place  Psychiatric: Mood and affect normal.  Demented so difficult to assess  Nursing note and vitals reviewed.  LABORATORY PANEL:   CBC  Recent Labs Lab 07/24/16 0549  WBC 6.8  HGB 9.8*  HCT 28.0*  PLT 222    ------------------------------------------------------------------------------------------------------------------ Chemistries   Recent Labs Lab 07/21/16 1038  07/23/16 0455 07/24/16 0549  NA 142  < > 141  --   K 3.4*  < > 4.2  --   CL 111  < > 110  --   CO2 26  < > 25  --   GLUCOSE 94  < > 189*  --   BUN 19  < > 18  --   CREATININE 0.37*  < > 0.43*  --   CALCIUM 8.8*  < > 8.3*  --   MG  --   < > 1.8 1.7  AST 18  --   --   --   ALT 7*  --   --   --   ALKPHOS 70  --   --   --   BILITOT 0.5  --   --   --   < > = values in this interval not displayed. RADIOLOGY:  No results found. ASSESSMENT AND PLAN:  75 year old African-American female with past medical history significant for history of dementia, seizures, diabetes, essential hypertension, coronary artery disease, stroke, malnutrition, admitted to the hospital with complaints of painful right foot.  #1. 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 Delana Meyer,  continue supportive therapy, pain medications, IV antibiotics. Plan is for Hospice after d/w family tomorrow #2. Hypokalemia: repleted and resolved #3. Anemia of chronic dz: stable #4. Severe chronic protein calorie Malnutrition: ? Hospice Home vs Home with Hospice (pt seem to be in agreement)  All the records are reviewed and case discussed with Care Management/Social Worker. Management plans discussed with the patient, family and they are in agreement.  CODE STATUS: DNR  TOTAL TIME TAKING CARE OF THIS PATIENT: 35 minutes.   More than 50% of the time was spent in counseling/coordination of care: YES  POSSIBLE D/C IN AM, DEPENDING ON CLINICAL CONDITION. Hopefully if family agrees [facility], if not, Home with Hospice. Palliative care is working on it.   [facility], Jakari Jacot M.D on 07/24/2016 at 4:09 PM  Between 7am to 6pm - Pager - [phone]  After 6pm  go to [email or web address] - Proofreader  [facility]  Office  [phone]  CC: Primary care physician; Sabino Snipes KEY, MD  Note: This dictation was prepared with Dragon dictation along with smaller phrase technology. Any transcriptional errors that result from this process are unintentional.

## 2016-07-24 NOTE — Progress Notes (Signed)
[facility]  Daily Progress Note   Subjective: 1 Day Post-Op: right above-the-knee amputation  Patient sleeping in bed. Seems to be comfortable. No distress or signs of pain.   Objective: Vitals:   07/23/16 1233 07/23/16 1345 07/23/16 2023 07/24/16 0442  BP: (!) 103/58 109/64 116/60 (!) 157/79  Pulse: 91 92 85 93  Resp: 16 16 17 18   Temp: 98.4 F (36.9 C) 98.4 F (36.9 C) (!) 96.9 F (36.1 C) 97.6 F (36.4 C)  TempSrc: Axillary Axillary Axillary   SpO2: 98% 100% 100% 100%  Weight:      Height:        Intake/Output Summary (Last 24 hours) at 07/24/16 [id number] Last data filed at 07/24/16 0555  Gross per 24 hour  Intake          2692.67 ml  Output              200 ml  Net          2492.67 ml   Physical Exam: Dementia at baseline, NAD CV: RRR Pulmonary: CTA Bilaterally Abdomen: Soft, Nontender, Nondistended Vascular: AKA OR dressing in place, no drainage noted. Minimal swelling.    Laboratory: CBC    Component Value Date/Time   WBC 6.8 07/24/2016 0549   HGB 9.8 (L) 07/24/2016 0549   HGB 15.0 03/21/2015 1130   HCT 28.0 (L) 07/24/2016 0549   HCT 45.7 03/21/2015 1130   PLT 222 07/24/2016 0549   PLT 206 03/21/2015 1130   BMET    Component Value Date/Time   NA 141 07/23/2016 0455   NA 139 03/21/2015 1130   K 4.2 07/23/2016 0455   K 3.7 03/21/2015 1130   CL 110 07/23/2016 0455   CL 102 03/21/2015 1130   CO2 25 07/23/2016 0455   CO2 29 03/21/2015 1130   GLUCOSE 189 (H) 07/23/2016 0455   GLUCOSE 184 (H) 03/21/2015 1130   BUN 18 07/23/2016 0455   BUN 19 03/21/2015 1130   CREATININE 0.43 (L) 07/23/2016 0455   CREATININE 1.02 (H) 03/21/2015 1130   CALCIUM 8.3 (L) 07/23/2016 0455   CALCIUM 9.5 03/21/2015 1130   GFRNONAA >60 07/23/2016 0455   GFRNONAA 55 (L) 03/21/2015 1130   GFRAA >60 07/23/2016 0455   GFRAA >60 03/21/2015 1130   Assessment/Planning: 75 year old female s/p right AKA - stable Dressing change Wed Dispo back to home  hospice most likely [facility].   Marcelle Overlie PA-C 07/24/2016 8:14 AM

## 2016-07-24 NOTE — Care Management Important Message (Signed)
Important Message  Patient Details  Name: Debra Hoffman MRN: [id number] Date of Birth: 07/11/1941   Medicare Important Message Given:  Yes    Carles Collet, RN 07/24/2016, 8:04 AM
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
## 2016-07-25 DIAGNOSIS — Z66 Do not resuscitate: Secondary | ICD-10-CM

## 2016-07-25 DIAGNOSIS — E43 Unspecified severe protein-calorie malnutrition: Secondary | ICD-10-CM

## 2016-07-25 DIAGNOSIS — F0280 Dementia in other diseases classified elsewhere without behavioral disturbance: Secondary | ICD-10-CM

## 2016-07-25 DIAGNOSIS — G3183 Dementia with Lewy bodies: Secondary | ICD-10-CM

## 2016-07-25 DIAGNOSIS — Z515 Encounter for palliative care: Secondary | ICD-10-CM

## 2016-07-25 LAB — TYPE AND SCREEN
ABO/RH(D): A POS
ANTIBODY SCREEN: NEGATIVE
UNIT DIVISION: 0
UNIT DIVISION: 0

## 2016-07-25 LAB — SURGICAL PATHOLOGY

## 2016-07-25 LAB — PREPARE RBC (CROSSMATCH)

## 2016-07-25 MED ORDER — POLYETHYLENE GLYCOL 3350 17 G PO PACK: 17.0000 g | PACK | Freq: Every day | ORAL | Status: DC

## 2016-07-25 NOTE — Progress Notes (Signed)
[facility]  Daily Progress Note   Subjective: 2 Days Post-Op: rightabove-the-knee amputation  Patient asking where her breakfast is this AM. Pain seems to be controlled - answered "no" to being in pain.   Objective: Vitals:   07/24/16 1239 07/24/16 2037 07/24/16 2037 07/25/16 0433  BP: 135/73 120/64 120/64 (!) 154/85  Pulse: (!) 110 95 95 93  Resp: 17 20 20 18   Temp: 98.3 F (36.8 C) 98.4 F (36.9 C) 98.4 F (36.9 C) 97.5 F (36.4 C)  TempSrc: Oral Oral Oral Oral  SpO2: 100% 99% 99% 100%  Weight:      Height:        Intake/Output Summary (Last 24 hours) at 07/25/16 0756 Last data filed at 07/25/16 [id number]  Gross per 24 hour  Intake           3328.8 ml  Output                0 ml  Net           3328.8 ml    Physical Exam: Dementia at baseline, NAD CV: RRR Pulmonary: CTA Bilaterally Abdomen: Soft, Nontender, Nondistended Vascular: AKA OR dressing in place, no drainage noted. Minimal swelling.    Laboratory: CBC    Component Value Date/Time   WBC 6.8 07/24/2016 0549   HGB 9.8 (L) 07/24/2016 0549   HGB 15.0 03/21/2015 1130   HCT 28.0 (L) 07/24/2016 0549   HCT 45.7 03/21/2015 1130   PLT 222 07/24/2016 0549   PLT 206 03/21/2015 1130    BMET    Component Value Date/Time   NA 141 07/23/2016 0455   NA 139 03/21/2015 1130   K 4.2 07/23/2016 0455   K 3.7 03/21/2015 1130   CL 110 07/23/2016 0455   CL 102 03/21/2015 1130   CO2 25 07/23/2016 0455   CO2 29 03/21/2015 1130   GLUCOSE 189 (H) 07/23/2016 0455   GLUCOSE 184 (H) 03/21/2015 1130   BUN 18 07/23/2016 0455   BUN 19 03/21/2015 1130   CREATININE 0.43 (L) 07/23/2016 0455   CREATININE 1.02 (H) 03/21/2015 1130   CALCIUM 8.3 (L) 07/23/2016 0455   CALCIUM 9.5 03/21/2015 1130   GFRNONAA >60 07/23/2016 0455   GFRNONAA 55 (L) 03/21/2015 1130   GFRAA >60 07/23/2016 0455   GFRAA >60 03/21/2015 1130    Assessment/Planning: 75 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

## 2016-07-25 NOTE — NC FL2 (Signed)
[place] LEVEL OF CARE SCREENING TOOL     IDENTIFICATION  Patient Name: Debra Hoffman Birthdate: [birth year] Sex: female Admission Date (Current Location): 07/21/2016  [place] and [place] Number:  Engineering geologist and Address:  [facility], [address] [place], [place] [address]      Provider Number: [id number]  Attending Physician Name and Address:  Max Sane, MD  Relative Name and Phone Number:       Current Level of Care: Hospital Recommended Level of Care: [place] Prior Approval Number:    Date Approved/Denied:   PASRR Number:    Discharge Plan: SNF    Current Diagnoses: Patient Active Problem List   Diagnosis Date Noted  . Protein-calorie malnutrition, severe 07/22/2016  . Necrotic toes ([place]) 07/21/2016  . Acute respiratory failure with hypoxia ([place]) 10/27/2015  . Acute encephalopathy 10/27/2015  . Renal insufficiency 10/27/2015  . Sterile pyuria 10/27/2015  . Diabetes mellitus ([place]) 10/27/2015  . Leukocytosis 10/27/2015  . Generalized weakness 10/27/2015  . Dysphagia 10/27/2015  . Elevated troponin   . Pneumonia 10/22/2015    Orientation RESPIRATION BLADDER Height & Weight     Self  Normal Continent Weight: 114 lb 8 oz (51.9 kg) Height:  5\' 5"  (165.1 cm)  BEHAVIORAL SYMPTOMS/MOOD NEUROLOGICAL BOWEL NUTRITION STATUS   (none)  (none) Continent Diet (dysphagia 1)  AMBULATORY STATUS COMMUNICATION OF NEEDS Skin   Total Care Verbally Surgical wounds                       Personal Care Assistance Level of Assistance  Total care       Total Care Assistance: Maximum assistance   Functional Limitations Info  Sight Sight Info: Impaired        SPECIAL CARE FACTORS FREQUENCY  PT (By licensed PT)                    Contractures Contractures Info: Not present    Additional Factors Info  Code Status Code Status Info: DNR             Current Medications (07/25/2016):   This is the current hospital active medication list Current Facility-Administered Medications  Medication Dose Route Frequency Provider Last Rate Last Dose  . 0.9 %  sodium chloride infusion   Intravenous Continuous Theodoro Grist, MD 100 mL/hr at 07/25/16 0920    . acetaminophen (TYLENOL) tablet 650 mg  650 mg Oral Q6H PRN Henreitta Leber, MD   650 mg at 07/24/16 0820   Or  . acetaminophen (TYLENOL) suppository 650 mg  650 mg Rectal Q6H PRN Henreitta Leber, MD      . divalproex (DEPAKOTE) DR tablet 250 mg  250 mg Oral q morning - 10a Henreitta Leber, MD   250 mg at 07/25/16 1022  . divalproex (DEPAKOTE) DR tablet 500 mg  500 mg Oral QHS Henreitta Leber, MD   500 mg at 07/24/16 2120  . enoxaparin (LOVENOX) injection 40 mg  40 mg Subcutaneous Q24H Kimberly A Stegmayer, PA-C   40 mg at 07/23/16 2241  . hydrochlorothiazide (MICROZIDE) capsule 12.5 mg  12.5 mg Oral Daily Henreitta Leber, MD   12.5 mg at 07/25/16 1022  . HYDROcodone-acetaminophen (NORCO/VICODIN) 5-325 MG per tablet 1-2 tablet  1-2 tablet Oral Q4H PRN [name], MD   2 tablet at 07/25/16 1022  . morphine 2 MG/ML injection 2 mg  2 mg Intravenous Q3H  PRN Henreitta Leber, MD   2 mg at 07/22/16 2256  . nitroGLYCERIN (NITROSTAT) SL tablet 0.4 mg  0.4 mg Sublingual Q5 min PRN Henreitta Leber, MD      . ondansetron [facility]) tablet 4 mg  4 mg Oral Q6H PRN Henreitta Leber, MD       Or  . ondansetron (ZOFRAN) injection 4 mg  4 mg Intravenous Q6H PRN Henreitta Leber, MD   4 mg at 07/23/16 0945  . pneumococcal 23 valent vaccine (PNU-IMMUNE) injection 0.5 mL  0.5 mL Intramuscular Tomorrow-1000 Henreitta Leber, MD      . potassium & sodium phosphates (PHOS-NAK) 280-160-250 MG packet 1 packet  1 packet Oral TID WC & HS Harrie Foreman, MD   1 packet at 07/25/16 1223     Discharge Medications: Please see discharge summary for a list of discharge medications.  Relevant Imaging Results:  Relevant Lab Results:   Additional  Information [id number]  Shela Leff, LCSW

## 2016-07-25 NOTE — Consult Note (Signed)
Consultation Note Date: 07/25/2016   Patient Name: Debra Hoffman  DOB: [birth year]  MRN: [id number]  Age / Sex: 75 y.o., female  PCP: Herminio Commons, MD Referring Physician: Max Sane, MD  Reason for Consultation: Establishing goals of care and Psychosocial/spiritual support  HPI/Patient Profile: 75 y.o. female   admitted on 07/21/2016 with PMH of  Dementia, seizures,, diabetes, hypertension, coronary artery disease, history of previous CVA who presents to the hospital from home due to pain in her right lower extremity and necrotic-looking right toes.  Per the granddaughter the patient went to visit some family members out of town and returned about a week or so ago and her grandmother's right foot was somewhat dark in color. Hospice services in place in the home, family noted uncontrollable pain and maggots on the wound on her right foot.   Patient admitted  thru the ER due to uncontrollable right foot pain and was noted to have significant vascular disease  noted on the CT angiogram, s/p AKA.  High risk for decompensation, family faced with advanced directive decisions and anticipatory care needs.  Clinical Assessment and Goals of Care:   This NP Wadie Lessen reviewed medical records, received report from team, assessed the patient and then meet at the patient's bedside along with her grand-daughter [name] main decision maker  to discuss diagnosis, prognosis, GOC, EOL wishes disposition and options.   A detailed discussion was had today regarding advanced directives.  Concepts specific to code status, artifical feeding and hydration, continued IV antibiotics and rehospitalization was had.  The difference between a aggressive medical intervention path  and a palliative comfort care path for this patient at this time was had.  Values and goals of care important to patient and family were attempted  to be elicited.  MOST form was discussed and reviewed  Natural trajectory and expectations at EOL were discussed.  Questions and concerns addressed.  Family encouraged to call with questions or concerns.  PMT will continue to support holistically.  SUMMARY OF RECOMMENDATIONS    Code Status/Advance Care Planning:  DNR  Palliative Prophylaxis:   Aspiration, Bowel Regimen, Delirium Protocol, Frequent Pain Assessment, Oral Care, Palliative Wound Care and Turn Reposition  Additional Recommendations (Limitations, Scope, Preferences):  Avoid Hospitalization and No Artificial Feeding  Psycho-social/Spiritual:   Desire for further Chaplaincy support:yes  Additional Recommendations: Education on Hospice   Family verbalizes concerns over current hospice service.  Edward Qualia feels that "things were not the way they should be" and she wishes to recind benefits at this time.    Her hope is for short term placement  at a SNF (possibly for wound care/ or rehabilitation) with intention of transitioning the patient home again once they settle in the [name] area.  She plans to reinstate hospice services at that time with [name].    Prognosis:   < 6 months- this will be impacted for desire for life prolonging interventions.  This patient is high risk for decompensation, she has poor po  intake and is dependent for all feeds and ADL  Discharge Planning:  If dc to SNF recommend PCS to follow: To Be Determined      Primary Diagnoses: Present on Admission: . Necrotic toes ([place])   I have reviewed the medical record, interviewed the patient and family, and examined the patient. The following aspects are pertinent.  Past Medical History:  Diagnosis Date  . Cervicalgia   . Constipation   . Coronary artery disease   . Dementia without behavioral disturbance   . Diabetes mellitus without complication ([place])   . Dysuria   . Glaucoma   . Hypertension   . Insomnia   .  Memory loss   . Pressure ulcer of foot, stage 2   . Seizures ([place])   . Stroke ([place])   . Urinary incontinence   . Wheelchair dependence    Social History   Social History  . Marital status: Unknown    Spouse name: N/A  . Number of children: N/A  . Years of education: N/A   Social History Main Topics  . Smoking status: Never Smoker  . Smokeless tobacco: Never Used  . Alcohol use No  . Drug use: No  . Sexual activity: Not Asked   Other Topics Concern  . None   Social History Narrative   ** Merged History Encounter **       Family History  Problem Relation Age of Onset  . Heart disease Mother   . Heart disease Father    Scheduled Meds: . divalproex  250 mg Oral q morning - 10a  . divalproex  500 mg Oral QHS  . enoxaparin (LOVENOX) injection  40 mg Subcutaneous Q24H  . feeding supplement (ENSURE ENLIVE)  237 mL Oral BID BM  . hydrochlorothiazide  12.5 mg Oral Daily  . pneumococcal 23 valent vaccine  0.5 mL Intramuscular Tomorrow-1000  . potassium & sodium phosphates  1 packet Oral TID WC & HS   Continuous Infusions: . sodium chloride 100 mL/hr at 07/25/16 0920   PRN Meds:.acetaminophen **OR** acetaminophen, HYDROcodone-acetaminophen, morphine injection, nitroGLYCERIN, ondansetron **OR** ondansetron (ZOFRAN) IV Medications Prior to Admission:  Prior to Admission medications   Medication Sig Start Date End Date Taking? Authorizing Provider  cephALEXin (KEFLEX) 500 MG capsule Take 500 mg by mouth 3 (three) times daily. For 7 days 07/14/16  Yes Historical Provider, MD  divalproex (DEPAKOTE SPRINKLE) 125 MG capsule Take 250-500 mg by mouth 2 (two) times daily. Take 250mg  in the morning and 500mg  in the evening.   Yes Historical Provider, MD  hydrochlorothiazide (MICROZIDE) 12.5 MG capsule Take 12.5 mg by mouth daily.   Yes Historical Provider, MD  naproxen (NAPROSYN) 500 MG tablet Take 500 mg by mouth 2 (two) times daily as needed.    Yes Historical Provider, MD  isosorbide  mononitrate (IMDUR) 30 MG 24 hr tablet Take 1 tablet (30 mg total) by mouth daily. 10/27/15   Theodoro Grist, MD  metoprolol tartrate (LOPRESSOR) 25 MG tablet Take 0.5 tablets (12.5 mg total) by mouth 2 (two) times daily. Patient not taking: Reported on 11/25/2015 10/27/15   Theodoro Grist, MD  nitroGLYCERIN (NITROSTAT) 0.4 MG SL tablet Place 0.4 mg under the tongue every 5 (five) minutes as needed for chest pain.    Historical Provider, MD   Allergies  Allergen Reactions  . Shellfish Allergy     Reaction: unknown   Review of Systems  Unable to perform ROS: Mental status change    Physical Exam  Constitutional:  She appears lethargic. She appears ill.  -frail, elderly  Cardiovascular: Normal rate, regular rhythm and normal heart sounds.   Pulmonary/Chest: She has decreased breath sounds in the right lower field and the left lower field.  Neurological: She appears lethargic.  Skin: Skin is warm and dry.    Vital Signs: BP (!) 154/85 (BP Location: Left Arm)   Pulse 93   Temp 97.5 F (36.4 C) (Oral)   Resp 18   Ht 5\' 5"  (1.651 m)   Wt 51.9 kg (114 lb 8 oz)   SpO2 100%   BMI 19.05 kg/m  Pain Assessment: 0-10 POSS *See Group Information*: S-Acceptable,Sleep, easy to arouse Pain Score: Asleep   SpO2: SpO2: 100 % O2 Device:SpO2: 100 % O2 Flow Rate: .   IO: Intake/output summary:  Intake/Output Summary (Last 24 hours) at 07/25/16 1014 Last data filed at 07/25/16 0718  Gross per 24 hour  Intake           2848.8 ml  Output                0 ml  Net           2848.8 ml    LBM: Last BM Date: 07/22/16 Baseline Weight: Weight: 51.9 kg (114 lb 8 oz) Most recent weight: Weight: 51.9 kg (114 lb 8 oz)      Palliative Assessment/Data: 20%   Discussed with Dr Manuella Ghazi  Time In: 1020 Time Out: 1135 Time Total: 75 min  Greater than 50%  of this time was spent counseling and coordinating care related to the above assessment and plan.  Signed by: Wadie Lessen, NP   Please contact  Palliative Medicine Team phone at [phone] for questions and concerns.  For individual provider: See Shea Evans
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
## 2016-07-25 NOTE — Progress Notes (Signed)
I had chat with her granddaughter: Elyn Aquas: [phone] this am. [facility] which she agrees but she says they don't live here and prefers for her to [facility] (as she works there) if available and eventually transition to [facility]. She is on her way here and will talk with CM/SW this morning.

## 2016-07-25 NOTE — Progress Notes (Signed)
[place] NAME: Debra Hoffman    MR#:  [id number]  DATE OF BIRTH:  1941-10-07  SUBJECTIVE:  CHIEF COMPLAINT:   Chief Complaint  Patient presents with  . Foot Pain  . Wound Infection  About same, no new issues REVIEW OF SYSTEMS:  Review of Systems  Unable to perform ROS: Dementia   DRUG ALLERGIES:   Allergies  Allergen Reactions  . Shellfish Allergy     Reaction: unknown   VITALS:  Blood pressure 112/60, pulse 81, temperature 97.7 F (36.5 C), temperature source Oral, resp. rate 18, height 5\' 5"  (1.651 m), weight 51.9 kg (114 lb 8 oz), SpO2 99 %. PHYSICAL EXAMINATION:  Physical Exam  Constitutional: She is oriented to person, place, and time. She appears to be writhing in pain and malnourished. She appears cachectic. She has a sickly appearance. She appears distressed.  HENT:  Head: Normocephalic and atraumatic.  Eyes: Conjunctivae and EOM are normal. Pupils are equal, round, and reactive to light.  Neck: Normal range of motion. Neck supple. No tracheal deviation present. No thyromegaly present.  Cardiovascular: Normal rate, regular rhythm and normal heart sounds.   Pulmonary/Chest: Effort normal and breath sounds normal. No respiratory distress. She has no wheezes. She exhibits no tenderness.  Abdominal: Soft. Bowel sounds are normal. She exhibits no distension. There is no tenderness.  Musculoskeletal: Normal range of motion.  Neurological: She is alert and oriented to person, place, and time. No cranial nerve deficit.  Skin: Skin is warm and dry. No rash noted.  Rt AKA - dressing in place  Psychiatric: Mood and affect normal.  Demented so difficult to assess  Nursing note and vitals reviewed.  LABORATORY PANEL:   CBC  Recent Labs Lab 07/24/16 0549  WBC 6.8  HGB 9.8*  HCT 28.0*  PLT 222    ------------------------------------------------------------------------------------------------------------------ Chemistries   Recent Labs Lab 07/21/16 1038  07/23/16 0455 07/24/16 0549  NA 142  < > 141  --   K 3.4*  < > 4.2  --   CL 111  < > 110  --   CO2 26  < > 25  --   GLUCOSE 94  < > 189*  --   BUN 19  < > 18  --   CREATININE 0.37*  < > 0.43*  --   CALCIUM 8.8*  < > 8.3*  --   MG  --   < > 1.8 1.7  AST 18  --   --   --   ALT 7*  --   --   --   ALKPHOS 70  --   --   --   BILITOT 0.5  --   --   --   < > = values in this interval not displayed. RADIOLOGY:  No results found. ASSESSMENT AND PLAN:  75 year old African-American female with past medical history significant for history of dementia, seizures, diabetes, essential hypertension, coronary artery disease, stroke, malnutrition, admitted to the hospital with complaints of painful right foot.  #1. 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 Delana Meyer,  continue supportive therapy, pain medications, IV antibiotics - can change to PO on D/C #2. Hypokalemia: repleted and resolved #3. Anemia of chronic dz: stable #4. Severe chronic protein calorie Malnutrition: ? Hospice Home vs Home with Hospice vs STR/SNF (Depending on physical therapy evaluation)  All the records are reviewed and case discussed with Care Management/Social Worker. Management plans discussed with the patient, family (had discussion with patient's granddaughter on phone) and they are in agreement.  CODE STATUS: DNR  TOTAL TIME TAKING CARE OF THIS PATIENT: 35 minutes.   More than 50% of the time was spent in counseling/coordination of care: YES  POSSIBLE D/C IN 1-2 days, DEPENDING ON CLINICAL CONDITION. Respite vs Hospice vs STR (Depending on physical therapy evaluation)  [facility], Burnice Vassel M.D on 07/25/2016 at 3:31 PM  Between 7am to 6pm - Pager  - [phone]  After 6pm go to [email or web address] - Proofreader  [facility]  Office  [phone]  CC: Primary care physician; Sabino Snipes KEY, MD  Note: This dictation was prepared with Dragon dictation along with smaller phrase technology. Any transcriptional errors that result from this process are unintentional.

## 2016-07-25 NOTE — Evaluation (Signed)
Physical Therapy Evaluation Patient Details Name: Debra Hoffman MRN: [id number] DOB: 1941-12-25 Today's Date: 07/25/2016   History of Present Illness  presented to ER from home with uncontrollable R LE pain and discolored toes; admitted with severe atherosclerotic disease with gangrene, tissue necrosis and maggot infestation.  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  Patient lacking full ROM at bilat elbow extension, L ankle DF (suggestive of limited OOB mobility at baseline); very high risk for contracture at R hip without proper positioning.  Currently requiring max/total assist from therapist for rolling and repositioning; unable to tolerate attempts at sitting edge of bed due to agitation with mobility attempts.  Anticipate total assist for all mobility efforts at this time.  Will continue to progress as tolerated. Would benefit from skilled PT to address above deficits and promote optimal return to PLOF; recommend transition to STR upon discharge from acute hospitalization.     Follow Up Recommendations SNF    Equipment Recommendations       Recommendations for Other Services       Precautions / Restrictions Precautions Precautions: Fall Restrictions Weight Bearing Restrictions: No      Mobility  Bed Mobility Overal bed mobility: Needs Assistance Bed Mobility: Rolling Rolling: Max assist;Total assist         General bed mobility comments: rolling, max/total bilat requiring hand-over-hand to faciltiate UE reaching and placement on rails.  Refused attempts at supine/sit  Transfers                 General transfer comment: unsafe/unable  Ambulation/Gait             General Gait Details: unsafe/unable  Stairs            Wheelchair Mobility    Modified Rankin (Stroke Patients Only)        Balance                                             Pertinent Vitals/Pain Pain Assessment: Faces Faces Pain Scale: Hurts even more Pain Location: R LE, bilat shoulders Pain Descriptors / Indicators: Aching;Grimacing;Moaning Pain Intervention(s): Limited activity within patient's tolerance;Monitored during session;Repositioned    Home Living Family/patient expects to be discharged to:: Private residence Living Arrangements: Other relatives Available Help at Discharge: Family Type of Home: House           Additional Comments: Lives with grand-daughter; unable to provide additional details due to cognitive status.      Prior Function           Comments: Patient unable to provide, brother at bedside unaware of details.     Hand Dominance        Extremity/Trunk Assessment   Upper Extremity Assessment: Generalized weakness (act assist elevation to shoulder height; able to flex elbows adequately for hand to mouth (self-feeding), lacking approx 15-20 degrees elbox ext bilat; bilat grasp/release grossly WFL (R UE generally edematous))           Lower Extremity Assessment: Generalized weakness (R hip grossly WFL, high risk for flexion contracture.  L LE grossly WFL except L ankle DF lacking approx 8-10 degrees)         Communication   Communication:  (patient moaning, intermittently yelling at therapist during  evaluation)  Cognition Arousal/Alertness: Awake/alert Behavior During Therapy: Agitated;Restless Overall Cognitive Status: Difficult to assess                      General Comments General comments (skin integrity, edema, etc.): R AKA post-surgical dressing intact; no visible drainage noted    Exercises        Assessment/Plan    PT Assessment Patient needs continued PT services  PT Diagnosis Generalized weakness;Acute pain   PT Problem List Decreased strength;Decreased range of motion;Decreased activity  tolerance;Decreased balance;Decreased mobility;Decreased cognition;Decreased knowledge of use of DME;Decreased safety awareness;Decreased skin integrity;Pain  PT Treatment Interventions DME instruction;Functional mobility training;Therapeutic activities;Therapeutic exercise;Balance training;Patient/family education   PT Goals (Current goals can be found in the Care Plan section) Acute Rehab PT Goals PT Goal Formulation: Patient unable to participate in goal setting Time For Goal Achievement: 08/08/16 Potential to Achieve Goals: Fair    Frequency Min 2X/week   Barriers to discharge Decreased caregiver support      Co-evaluation               End of Session   Activity Tolerance: Treatment limited secondary to agitation Patient left: in bed;with call bell/phone within reach;with bed alarm set;with family/visitor present           Time: 1445-1457 PT Time Calculation (min) (ACUTE ONLY): 12 min   Charges:   PT Evaluation $PT Eval Moderate Complexity: 1 Procedure     PT G Codes:       Floye Fesler H. Owens Shark, PT, DPT, NCS 07/25/16, 3:17 PM [phone]

## 2016-07-25 NOTE — Progress Notes (Signed)
Per Dr. Manuella Ghazi okay to discontinue order for MIralax as pt already had BM once today.

## 2016-07-26 DIAGNOSIS — Z515 Encounter for palliative care: Secondary | ICD-10-CM

## 2016-07-26 DIAGNOSIS — Z66 Do not resuscitate: Secondary | ICD-10-CM

## 2016-07-26 LAB — BASIC METABOLIC PANEL
ANION GAP: 4 — AB (ref 5–15)
BUN: 8 mg/dL (ref 6–20)
CALCIUM: 8.3 mg/dL — AB (ref 8.9–10.3)
CHLORIDE: 112 mmol/L — AB (ref 101–111)
CO2: 27 mmol/L (ref 22–32)
Creatinine, Ser: 0.34 mg/dL — ABNORMAL LOW (ref 0.44–1.00)
GFR calc Af Amer: >60 mL/min (ref >60)
GFR calc non Af Amer: >60 mL/min (ref >60)
GLUCOSE: 76 mg/dL (ref 65–99)
Potassium: 3.4 mmol/L — ABNORMAL LOW (ref 3.5–5.1)
Sodium: 143 mmol/L (ref 135–145)

## 2016-07-26 LAB — CBC
HEMATOCRIT: 27.8 % — AB (ref 35.0–47.0)
HEMOGLOBIN: 9.4 g/dL — AB (ref 12.0–16.0)
MCH: 28.6 pg (ref 26.0–34.0)
MCHC: 34 g/dL (ref 32.0–36.0)
MCV: 84.2 fL (ref 80.0–100.0)
Platelets: 271 10*3/uL (ref 150–440)
RBC: 3.3 MIL/uL — ABNORMAL LOW (ref 3.80–5.20)
RDW: 15.1 % — AB (ref 11.5–14.5)
WBC: 5.7 10*3/uL (ref 3.6–11.0)

## 2016-07-26 NOTE — Clinical Social Work Note (Signed)
Patient discharging today to go to [facility]. CSW contacted Broadus John at [facility] and sent discharge information. CSW contacted patient's granddaughter and she is aware of discharge as well. Nurse to call report and patient to transport via EMS. Shela Leff MSW,LCSW

## 2016-07-26 NOTE — Discharge Instructions (Signed)
Gangrene °Gangrene is a medical condition caused by a lack of blood supply to an area of your body. Oxygen travels through your blood, so less blood means less oxygen. Without oxygen, your body tissue will start to die.  °Gangrene can affect many parts of your body. Gangrene is most common on the skin (external gangrene), but it can also affect internal parts of the body (internal gangrene).  °Gangrene requires emergency treatment. °CAUSES  °Any condition that cuts off blood supply can cause gangrene. Common causes include: °· Injuries. °· Blood vessel disease. °· Diabetes. °· Infections. °RISK FACTORS °You may be at increased risk for gangrene if you have: °· A serious injury. °· Recent surgery. °· Diabetes. °· A weak body defense system (immune system). °· Narrowing of your arteries (arteriosclerosis). °· An immune system disease that causes your arteries to constrict (Raynaud disease). °· A history of IV drug use. °SIGNS AND SYMPTOMS °The signs and symptoms depend on what part of your body is affected. If you have external gangrene, you may have: °· Severe pain followed by loss of feeling. °· Redness and swelling. °· Crackling sounds when you press on a swollen area of skin. °· A wound with bad-smelling drainage. °· Changes in the color of your skin. It may turn red, blue, black, or white. °· Fever and chills. °If you have internal gangrene, you may have: °· Fever and chills. °· Confusion. °· Dizziness. °· Severe pain. °· Rapid heartbeat and breathing. °· Loss of appetite. °· Nausea or vomiting. °DIAGNOSIS  °To diagnose gangrene, your health care provider will take your medical history and do a physical exam. Tests may also be done to help in making the diagnosis. These may include: °· X-rays of your blood vessels after injection of a dye (arteriogram). °· Blood tests to look for an infection in your blood. °· Imaging studies such as a CT scan or MRI. °· Taking a swab from a draining wound to look for bacteria in  the laboratory (culture). °· Taking a piece of tissue (biopsy) to look for cell death in the laboratory. °· A surgical procedure to look for gangrene inside your body. °TREATMENT  °Treatment for gangrene depends on the cause and the area of your body that is affected. Gangrene is usually treated in the hospital. It is very important to start treatment early before gangrene spreads. Treatment may include the following: °· Medicine. You may get high doses of antibiotic medicine for gangrene caused by infection. The antibiotics may be given directly into a vein through an IV tube. °· Surgery. Surgical options may include: °¨ Debridement. This is surgery to remove dead tissue. You may need to have debridement several times. °¨ Bypass or angioplasty. This surgery improves the blood supply to an affected area. °¨ Amputation. Sometimes it is necessary to remove a body part. °· Oxygen therapy. This involves treatment in a chamber designed to provide high levels of oxygen (hyperbaric oxygen therapy). It can improve the oxygen supply to an affected area. °· Supportive care. This may include: °¨ IV fluids and nutrients. °¨ Pain medicines. °¨ Blood thinners to prevent blood clots. °HOME CARE INSTRUCTIONS °· Take medicines only as directed by your health care provider. °· If you were prescribed an antibiotic medicine, finish it all even if you start to feel better. °· Clean any cuts or scratches with an antiseptic. °· Cover any open wounds. °· Check wounds for signs of infection. Watch for redness or swelling. °· Do not use any   tobacco products, including cigarettes, chewing tobacco, or electronic cigarettes. If you need help quitting, ask your health care provider. °· Limit alcohol intake to no more than 1 drink per day for nonpregnant women and 2 drinks per day for men. One drink equals 12 ounces of beer, 5 ounces of wine, or 1½ ounces of hard liquor. °· Eat a healthy diet and maintain a healthy weight. °· Get some exercise on  most days of the week. Ask your health care provider to suggest activities that are safe for you. °· If you have diabetes or another blood vessel disease, check your feet often for signs of injury or infection. °· After any surgery you have, follow your health care provider's instructions carefully. °· Keep all follow-up visits as directed by your health care provider. This is important. °SEEK MEDICAL CARE IF: °· You have a wound or sore that is not healing. °· You have color changes (white, red, blue, or black) in an area of your skin. °· You have a wound or sore with smelly drainage. °SEEK IMMEDIATE MEDICAL CARE IF:  °· You have rapidly worsening pain at the site of a skin infection or wound. °· You lose sensation at the site of a skin infection or wound. °· You have unexplained: °¨ Fever. °¨ Chills. °¨ Confusion. °¨ Fainting. °MAKE SURE YOU:  °· Understand these instructions. °· Will watch your condition. °· Will get help right away if you are not doing well or get worse. °  °This information is not intended to replace advice given to you by your health care provider. Make sure you discuss any questions you have with your health care provider. °  °Document Released: 10/12/2004 Document Revised: 01/01/2015 Document Reviewed: 02/04/2014 °Elsevier Interactive Patient Education ©2016 [employer]. ° °

## 2016-07-26 NOTE — Progress Notes (Signed)
Report called to [facility] for pt's discharge, granddaughter at bedside and agree to d/c. VS wnl, rt stump dressing changed by Dr Delana Meyer and me. No ss of pain at this time. IV catheters removed and pressure applied at site, no bleeding noted.

## 2016-07-26 NOTE — Progress Notes (Signed)
Patient picked up by EMS, patient clean and dry at time of d/c, paperwork and report given to EMS personnel.

## 2016-07-26 NOTE — Clinical Social Work Placement (Signed)
   CLINICAL SOCIAL WORK PLACEMENT  NOTE  Date:  07/26/2016  Patient Details  Name: Debra Hoffman MRN: [id number] Date of Birth: [birth year]  Clinical Social Work is seeking post-discharge placement for this patient at the [place] level of care (*CSW will initial, date and re-position this form in  chart as items are completed):  Yes   Patient/family provided with [facility] Work Department's list of facilities offering this level of care within the geographic area requested by the patient (or if unable, by the patient's family).      Patient/family informed of their freedom to choose among providers that offer the needed level of care, that participate in Medicare, Medicaid or managed care program needed by the patient, have an available bed and are willing to accept the patient.  Yes   Patient/family informed of [facility]'s ownership interest in [facility] and [facility], as well as of the fact that they are under no obligation to receive care at these facilities.  PASRR submitted to EDS on       PASRR number received on       Existing PASRR number confirmed on 07/26/16     FL2 transmitted to all facilities in geographic area requested by pt/family on       FL2 transmitted to all facilities within larger geographic area on       Patient informed that his/her managed care company has contracts with or will negotiate with certain facilities, including the following:        Yes   Patient/family informed of bed offers received.  Patient chooses bed at  [facility])     Physician recommends and patient chooses bed at  [facility])    Patient to be transferred to  ([facility]) on 07/26/16.  Patient to be transferred to facility by  (EMS)     Patient family notified on 07/26/16 of transfer.  Name of family member notified:   (granddaughter)     PHYSICIAN       Additional Comment:     _______________________________________________ Shela Leff, LCSW 07/26/2016, 12:15 PM

## 2016-07-26 NOTE — Discharge Summary (Signed)
[place] at [place] NAME: Debra Hoffman    MR#:  [id number]  DATE OF BIRTH:  [birth year]  DATE OF ADMISSION:  07/21/2016   ADMITTING PHYSICIAN: Henreitta Leber, MD  DATE OF DISCHARGE: 07/26/2016  PRIMARY CARE PHYSICIAN: SOLES, [name], MD   ADMISSION DIAGNOSIS:  Infestation by maggots [B87.9] Gangrene of foot ([place]) [I96] DISCHARGE DIAGNOSIS:  Active Problems:   Necrotic toes (HCC)   Protein-calorie malnutrition, severe   Palliative care encounter   DNR (do not resuscitate)  SECONDARY DIAGNOSIS:   Past Medical History:  Diagnosis Date  . Cervicalgia   . Constipation   . Coronary artery disease   . Dementia without behavioral disturbance   . Diabetes mellitus without complication ([place])   . Dysuria   . Glaucoma   . Hypertension   . Insomnia   . Memory loss   . Pressure ulcer of foot, stage 2   . Seizures ([place])   . Stroke ([place])   . Urinary incontinence   . Wheelchair dependence    HOSPITAL COURSE:  75 year old African-American female with past medical history significant for history of dementia, seizures, diabetes, essential hypertension, coronary artery disease, stroke, malnutrition, admitted to the hospital with complaints of painful right foot.  #1. 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 Delana Meyer, continue supportive therapy #2. Hypokalemia: repleted and resolved #3. Anemia of chronic dz: stable #4. Severe chronic protein calorie Malnutrition:   Has very poor prognosis but family would like to take [facility] to [facility] for time being until they sort out Hospice at the place where they live. DISCHARGE CONDITIONS:  serious CONSULTS OBTAINED:  Treatment Team:  Sharlotte Alamo, DPM [name], MD DRUG ALLERGIES:   Allergies  Allergen Reactions  . Shellfish Allergy     Reaction: unknown    DISCHARGE MEDICATIONS:     Medication List    STOP taking these medications   cephALEXin 500 MG capsule Commonly known as:  KEFLEX   naproxen 500 MG tablet Commonly known as:  NAPROSYN   nitroGLYCERIN 0.4 MG SL tablet Commonly known as:  NITROSTAT     TAKE these medications   divalproex 125 MG capsule Commonly known as:  DEPAKOTE SPRINKLE Take 250-500 mg by mouth 2 (two) times daily. Take 250mg  in the morning and 500mg  in the evening.   hydrochlorothiazide 12.5 MG capsule Commonly known as:  MICROZIDE Take 12.5 mg by mouth daily.   isosorbide mononitrate 30 MG 24 hr tablet Commonly known as:  IMDUR Take 1 tablet (30 mg total) by mouth daily.   metoprolol tartrate 25 MG tablet Commonly known as:  LOPRESSOR Take 0.5 tablets (12.5 mg total) by mouth 2 (two) times daily.      DISCHARGE INSTRUCTIONS:   DIET:  Regular diet DISCHARGE CONDITION:  Serious ACTIVITY:  Activity as tolerated OXYGEN:  Home Oxygen: No.  Oxygen Delivery: room air DISCHARGE LOCATION:  nursing home   If you experience worsening of your admission symptoms, develop shortness of breath, life threatening emergency, suicidal or homicidal thoughts you must seek medical attention immediately by calling 911 or calling your MD immediately  if symptoms less severe.  You Must read complete instructions/literature along with all the possible adverse reactions/side effects for all the Medicines you take and that have been prescribed to you. Take any new Medicines after you have completely understood and  accpet all the possible adverse reactions/side effects.   Please note  You were cared for by a hospitalist during your hospital stay. If you have any questions about your discharge medications or the care you received while you were in the hospital after you are discharged, you can call the unit and asked to speak with the hospitalist on call if the hospitalist that took care of you is not available. Once  you are discharged, your primary care physician will handle any further medical issues. Please note that NO REFILLS for any discharge medications will be authorized once you are discharged, as it is imperative that you return to your primary care physician (or establish a relationship with a primary care physician if you do not have one) for your aftercare needs so that they can reassess your need for medications and monitor your lab values.    On the day of Discharge:  VITAL SIGNS:  Blood pressure (!) 160/97, pulse 92, temperature 99.1 F (37.3 C), temperature source Oral, resp. rate 18, height 5\' 5"  (1.651 m), weight 51.9 kg (114 lb 8 oz), SpO2 100 %. PHYSICAL EXAMINATION:  GENERAL:  75 y.o.-year-old patient lying in the bed with no acute distress.  She appears to be writhing in pain and malnourished. She appears cachectic. She has a sickly appearance. She appears distressed.  HENT: Head: Normocephalic and atraumatic.  Eyes: Conjunctivae and EOM are normal. Pupils are equal, round, and reactive to light.  Neck: Normal range of motion. Neck supple. No tracheal deviation present. No thyromegaly present.  Cardiovascular: Normal rate, regular rhythm and normal heart sounds.   Pulmonary/Chest: Effort normal and breath sounds normal. No respiratory distress. She has no wheezes. She exhibits no tenderness.  Abdominal: Soft. Bowel sounds are normal. She exhibits no distension. There is no tenderness.  Musculoskeletal: Normal range of motion.  Neurological: She is not oriented to person, place, and time. No cranial nerve deficit.  Skin: Skin is warm and dry. No rash noted.  Rt AKA - dressing in place  Psychiatric: Mood and affect difficult to evaluate due to confusion and current mental state Demented so difficult to assess  Nursing note and vitals reviewed. DATA REVIEW:   CBC  Recent Labs Lab 07/26/16 0354  WBC 5.7  HGB 9.4*  HCT 27.8*  PLT 271    Chemistries   Recent Labs Lab  07/21/16 1038  07/24/16 0549 07/26/16 0354  NA 142  < >  --  143  K 3.4*  < >  --  3.4*  CL 111  < >  --  112*  CO2 26  < >  --  27  GLUCOSE 94  < >  --  76  BUN 19  < >  --  8  CREATININE 0.37*  < >  --  0.34*  CALCIUM 8.8*  < >  --  8.3*  MG  --   < > 1.7  --   AST 18  --   --   --   ALT 7*  --   --   --   ALKPHOS 70  --   --   --   BILITOT 0.5  --   --   --   < > = values in this interval not displayed.   Follow-up Information    SOLES, [name], MD. Schedule an appointment as soon as possible for a visit in 1 week(s).   Specialty:  Family Medicine Contact information: [address] [place] [address] [phone]        Schnier, Dolores Lory, MD. Schedule an appointment as soon as possible for a visit in 2 week(s).   Specialties:  Vascular Surgery, Cardiology, Radiology, Vascular Surgery Contact information: [place] [address] [phone]          Highly recommend Hospice, very poor prognosis.  Management plans discussed with the patient, family and they are in agreement.  CODE STATUS: DNR  TOTAL TIME TAKING CARE OF THIS PATIENT: 45 minutes.    [facility], Shatyra Becka M.D on 07/26/2016 at 8:55 AM  Between 7am to 6pm - Pager - [phone]  After 6pm go to [email or web address] - Proofreader  [facility]  Hospitalists  Office  [phone]  CC: Primary care physician; Sabino Snipes KEY, MD   Note: This dictation was prepared with Dragon dictation along with smaller phrase technology. Any transcriptional errors that result from this process are unintentional.

## 2016-09-04 ENCOUNTER — Encounter (HOSPITAL_COMMUNITY): Payer: Self-pay

## 2016-09-04 ENCOUNTER — Emergency Department (HOSPITAL_COMMUNITY)

## 2016-09-04 ENCOUNTER — Inpatient Hospital Stay (HOSPITAL_COMMUNITY)
Admission: EM | Admit: 2016-09-04 | Discharge: 2016-09-07 | DRG: 871 | Disposition: A | Discharge status: Home / Self Care | Attending: Internal Medicine | Admitting: Internal Medicine

## 2016-09-04 DIAGNOSIS — L8990 Pressure ulcer of unspecified site, unspecified stage: Secondary | ICD-10-CM | POA: Diagnosis present

## 2016-09-04 DIAGNOSIS — J690 Pneumonitis due to inhalation of food and vomit: Secondary | ICD-10-CM | POA: Diagnosis present

## 2016-09-04 DIAGNOSIS — Z8673 Personal history of transient ischemic attack (TIA), and cerebral infarction without residual deficits: Secondary | ICD-10-CM

## 2016-09-04 DIAGNOSIS — I2510 Atherosclerotic heart disease of native coronary artery without angina pectoris: Secondary | ICD-10-CM | POA: Diagnosis present

## 2016-09-04 DIAGNOSIS — Z993 Dependence on wheelchair: Secondary | ICD-10-CM

## 2016-09-04 DIAGNOSIS — J189 Pneumonia, unspecified organism: Secondary | ICD-10-CM

## 2016-09-04 DIAGNOSIS — E119 Type 2 diabetes mellitus without complications: Secondary | ICD-10-CM | POA: Diagnosis present

## 2016-09-04 DIAGNOSIS — Z7189 Other specified counseling: Secondary | ICD-10-CM | POA: Diagnosis not present

## 2016-09-04 DIAGNOSIS — H409 Unspecified glaucoma: Secondary | ICD-10-CM | POA: Diagnosis present

## 2016-09-04 DIAGNOSIS — R627 Adult failure to thrive: Secondary | ICD-10-CM | POA: Diagnosis present

## 2016-09-04 DIAGNOSIS — R1310 Dysphagia, unspecified: Secondary | ICD-10-CM | POA: Diagnosis not present

## 2016-09-04 DIAGNOSIS — R0902 Hypoxemia: Secondary | ICD-10-CM | POA: Diagnosis present

## 2016-09-04 DIAGNOSIS — Z89611 Acquired absence of right leg above knee: Secondary | ICD-10-CM | POA: Diagnosis not present

## 2016-09-04 DIAGNOSIS — R4182 Altered mental status, unspecified: Secondary | ICD-10-CM | POA: Diagnosis present

## 2016-09-04 DIAGNOSIS — I96 Gangrene, not elsewhere classified: Secondary | ICD-10-CM | POA: Diagnosis present

## 2016-09-04 DIAGNOSIS — I10 Essential (primary) hypertension: Secondary | ICD-10-CM | POA: Diagnosis present

## 2016-09-04 DIAGNOSIS — G9340 Encephalopathy, unspecified: Secondary | ICD-10-CM | POA: Diagnosis present

## 2016-09-04 DIAGNOSIS — A419 Sepsis, unspecified organism: Principal | ICD-10-CM

## 2016-09-04 DIAGNOSIS — Z66 Do not resuscitate: Secondary | ICD-10-CM | POA: Diagnosis present

## 2016-09-04 DIAGNOSIS — T17908A Unspecified foreign body in respiratory tract, part unspecified causing other injury, initial encounter: Secondary | ICD-10-CM

## 2016-09-04 DIAGNOSIS — I998 Other disorder of circulatory system: Secondary | ICD-10-CM

## 2016-09-04 DIAGNOSIS — D72829 Elevated white blood cell count, unspecified: Secondary | ICD-10-CM | POA: Diagnosis present

## 2016-09-04 DIAGNOSIS — Z8249 Family history of ischemic heart disease and other diseases of the circulatory system: Secondary | ICD-10-CM | POA: Diagnosis not present

## 2016-09-04 DIAGNOSIS — Z515 Encounter for palliative care: Secondary | ICD-10-CM

## 2016-09-04 DIAGNOSIS — F0390 Unspecified dementia without behavioral disturbance: Secondary | ICD-10-CM | POA: Diagnosis present

## 2016-09-04 LAB — LACTIC ACID, PLASMA
LACTIC ACID, VENOUS: 4.8 mmol/L — AB (ref 0.5–1.9)
LACTIC ACID, VENOUS: 4 mmol/L — AB (ref 0.5–1.9)
LACTIC ACID, VENOUS: 1.4 mmol/L (ref 0.5–1.9)
Lactic Acid, Venous: 3.8 mmol/L (ref 0.5–1.9)
Lactic Acid, Venous: 2.9 mmol/L (ref 0.5–1.9)

## 2016-09-04 LAB — CBC WITH DIFFERENTIAL/PLATELET
BASOS ABS: 0 10*3/uL (ref 0.0–0.1)
BASOS PCT: 0 %
BASOS PCT: 0 %
Basophils Absolute: 0 10*3/uL (ref 0.0–0.1)
EOS PCT: 0 %
Eosinophils Absolute: 0 10*3/uL (ref 0.0–0.7)
Eosinophils Absolute: 0 10*3/uL (ref 0.0–0.7)
Eosinophils Relative: 0 %
HCT: 28.5 % — ABNORMAL LOW (ref 36.0–46.0)
HEMATOCRIT: 36.3 % (ref 36.0–46.0)
HEMOGLOBIN: 11.5 g/dL — AB (ref 12.0–15.0)
HEMOGLOBIN: 9.2 g/dL — AB (ref 12.0–15.0)
Lymphocytes Relative: 4 %
Lymphocytes Relative: 7 %
Lymphs Abs: 0.7 10*3/uL (ref 0.7–4.0)
Lymphs Abs: 1.1 10*3/uL (ref 0.7–4.0)
MCH: 27.5 pg (ref 26.0–34.0)
MCH: 27.9 pg (ref 26.0–34.0)
MCHC: 31.7 g/dL (ref 30.0–36.0)
MCHC: 32.3 g/dL (ref 30.0–36.0)
MCV: 86.8 fL (ref 78.0–100.0)
MCV: 86.4 fL (ref 78.0–100.0)
MONO ABS: 0.5 10*3/uL (ref 0.1–1.0)
MONOS PCT: 3 %
Monocytes Absolute: 0.5 10*3/uL (ref 0.1–1.0)
Monocytes Relative: 3 %
NEUTROS ABS: 15.4 10*3/uL — AB (ref 1.7–7.7)
NEUTROS ABS: 14 10*3/uL — AB (ref 1.7–7.7)
NEUTROS PCT: 93 %
NEUTROS PCT: 90 %
Platelets: 359 10*3/uL (ref 150–400)
Platelets: 331 10*3/uL (ref 150–400)
RBC: 4.18 MIL/uL (ref 3.87–5.11)
RBC: 3.3 MIL/uL — AB (ref 3.87–5.11)
RDW: 14.9 % (ref 11.5–15.5)
RDW: 15 % (ref 11.5–15.5)
WBC Morphology: INCREASED
WBC: 16.6 10*3/uL — ABNORMAL HIGH (ref 4.0–10.5)
WBC: 15.6 10*3/uL — AB (ref 4.0–10.5)

## 2016-09-04 LAB — COMPREHENSIVE METABOLIC PANEL
ALBUMIN: 1.9 g/dL — AB (ref 3.5–5.0)
ALT: 9 U/L — AB (ref 14–54)
ALT: 8 U/L — AB (ref 14–54)
AST: 27 U/L (ref 15–41)
AST: 24 U/L (ref 15–41)
Albumin: 2.5 g/dL — ABNORMAL LOW (ref 3.5–5.0)
Alkaline Phosphatase: 75 U/L (ref 38–126)
Alkaline Phosphatase: 66 U/L (ref 38–126)
Anion gap: 13 (ref 5–15)
Anion gap: 0 — ABNORMAL LOW (ref 5–15)
BILIRUBIN TOTAL: 0.6 mg/dL (ref 0.3–1.2)
BUN: 31 mg/dL — AB (ref 6–20)
BUN: 31 mg/dL — AB (ref 6–20)
CHLORIDE: 101 mmol/L (ref 101–111)
CO2: 25 mmol/L (ref 22–32)
CO2: 25 mmol/L (ref 22–32)
CREATININE: 0.78 mg/dL (ref 0.44–1.00)
Calcium: 9.4 mg/dL (ref 8.9–10.3)
Calcium: 8 mg/dL — ABNORMAL LOW (ref 8.9–10.3)
Chloride: 113 mmol/L — ABNORMAL HIGH (ref 101–111)
Creatinine, Ser: 0.68 mg/dL (ref 0.44–1.00)
GFR calc Af Amer: >60 mL/min (ref >60)
GFR calc non Af Amer: >60 mL/min (ref >60)
GFR calc non Af Amer: >60 mL/min (ref >60)
GLUCOSE: 130 mg/dL — AB (ref 65–99)
Glucose, Bld: 239 mg/dL — ABNORMAL HIGH (ref 65–99)
POTASSIUM: 3.7 mmol/L (ref 3.5–5.1)
Potassium: 4.4 mmol/L (ref 3.5–5.1)
SODIUM: 139 mmol/L (ref 135–145)
Sodium: 138 mmol/L (ref 135–145)
TOTAL PROTEIN: 6 g/dL — AB (ref 6.5–8.1)
Total Bilirubin: 0.7 mg/dL (ref 0.3–1.2)
Total Protein: 7.2 g/dL (ref 6.5–8.1)

## 2016-09-04 LAB — VALPROIC ACID LEVEL: VALPROIC ACID LVL: 30 ug/mL — AB (ref 50.0–100.0)

## 2016-09-04 LAB — PROCALCITONIN: Procalcitonin: 6.67 ng/mL

## 2016-09-04 LAB — URINE MICROSCOPIC-ADD ON: Squamous Epithelial / LPF: NONE SEEN

## 2016-09-04 LAB — URINALYSIS, ROUTINE W REFLEX MICROSCOPIC
GLUCOSE, UA: NEGATIVE mg/dL
HGB URINE DIPSTICK: NEGATIVE
Leukocytes, UA: NEGATIVE
Nitrite: NEGATIVE
PH: 5.5 (ref 5.0–8.0)
SPECIFIC GRAVITY, URINE: 1.025 (ref 1.005–1.030)

## 2016-09-04 LAB — PROTIME-INR
INR: 1.46
Prothrombin Time: 17.9 seconds — ABNORMAL HIGH (ref 11.4–15.2)

## 2016-09-04 LAB — MRSA PCR SCREENING: MRSA BY PCR: NEGATIVE

## 2016-09-04 LAB — APTT: APTT: 46 s — AB (ref 24–36)

## 2016-09-04 MED ORDER — ENOXAPARIN SODIUM 40 MG/0.4ML ~~LOC~~ SOLN
40.0000 mg | SUBCUTANEOUS | Status: DC
  Administered 2016-09-04: 40 mg via SUBCUTANEOUS
  Filled 2016-09-04: qty 0.4

## 2016-09-04 MED ORDER — SODIUM CHLORIDE 0.9 % IV BOLUS (SEPSIS)
250.0000 mL | Freq: Once | INTRAVENOUS | Status: AC
  Administered 2016-09-04: 250 mL via INTRAVENOUS

## 2016-09-04 MED ORDER — VANCOMYCIN HCL IN DEXTROSE 1-5 GM/200ML-% IV SOLN
1000.0000 mg | Freq: Once | INTRAVENOUS | Status: AC
  Administered 2016-09-04: 1000 mg via INTRAVENOUS
  Filled 2016-09-04: qty 200

## 2016-09-04 MED ORDER — VANCOMYCIN HCL IN DEXTROSE 1-5 GM/200ML-% IV SOLN: 1000.0000 mg | Freq: Once | INTRAVENOUS | Status: DC

## 2016-09-04 MED ORDER — SODIUM CHLORIDE 0.9 % IV BOLUS (SEPSIS): 250.0000 mL | Freq: Once | INTRAVENOUS | Status: AC

## 2016-09-04 MED ORDER — SODIUM CHLORIDE 0.9 % IV BOLUS (SEPSIS)
1000.0000 mL | Freq: Once | INTRAVENOUS | Status: AC
  Administered 2016-09-04: 1000 mL via INTRAVENOUS

## 2016-09-04 MED ORDER — MORPHINE SULFATE (CONCENTRATE) 10 MG/0.5ML PO SOLN
2.5000 mg | ORAL | Status: DC | PRN
  Administered 2016-09-06: 2.6 mg via SUBLINGUAL
  Filled 2016-09-04: qty 0.5

## 2016-09-04 MED ORDER — VANCOMYCIN HCL 500 MG IV SOLR
500.0000 mg | INTRAVENOUS | Status: DC
  Administered 2016-09-05 – 2016-09-06 (×2): 500 mg via INTRAVENOUS
  Filled 2016-09-04 (×3): qty 500

## 2016-09-04 MED ORDER — PIPERACILLIN-TAZOBACTAM 3.375 G IVPB 30 MIN: 3.3750 g | Freq: Once | INTRAVENOUS | Status: DC

## 2016-09-04 MED ORDER — PIPERACILLIN-TAZOBACTAM 3.375 G IVPB 30 MIN
3.3750 g | Freq: Once | INTRAVENOUS | Status: AC
  Administered 2016-09-04: 3.375 g via INTRAVENOUS
  Filled 2016-09-04: qty 50

## 2016-09-04 MED ORDER — PIPERACILLIN-TAZOBACTAM 3.375 G IVPB
3.3750 g | Freq: Three times a day (TID) | INTRAVENOUS | Status: DC
  Administered 2016-09-04 – 2016-09-06 (×6): 3.375 g via INTRAVENOUS
  Filled 2016-09-04 (×6): qty 50

## 2016-09-04 MED ORDER — SODIUM CHLORIDE 0.9 % IV SOLN
1000.0000 mL | INTRAVENOUS | Status: DC
  Administered 2016-09-04 – 2016-09-05 (×5): 1000 mL via INTRAVENOUS

## 2016-09-04 NOTE — ED Notes (Signed)
Debra Hoffman [id number]   [place] daughter

## 2016-09-04 NOTE — ED Triage Notes (Signed)
Per Dr. Sonny Dandy at [facility] home, pt was unresponsive yesterday and today still unresponsive and has rattling in chest.  Granddaughter says her sister saw pt yesterday and says she wasn't unresponsive when she saw her yesterday.  EMS brought at copy of a DNR form but it isn't the original.

## 2016-09-04 NOTE — ED Notes (Signed)
Floor stated will call back for report

## 2016-09-04 NOTE — ED Triage Notes (Signed)
Dr. Rancour at bedside. 

## 2016-09-04 NOTE — ED Notes (Signed)
No palpable pulses or doppler pulses found to left PT/DP. EDP also attempted. EDP talking with POA who just arrived.

## 2016-09-04 NOTE — ED Notes (Signed)
Lab in room attempting to draw second set of blood cultures.

## 2016-09-04 NOTE — ED Notes (Addendum)
Lactic Acid 4.8. EDP aware.
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
## 2016-09-04 NOTE — ED Notes (Signed)
Hospice Home updated.

## 2016-09-04 NOTE — ED Notes (Signed)
Floor unable to take report at this time.

## 2016-09-04 NOTE — Progress Notes (Signed)
ANTIBIOTIC CONSULT NOTE-Preliminary  Pharmacy Consult for Vancomycin and Zosyn Indication: sepsis  Allergies  Allergen Reactions  . Ativan [Lorazepam]   . Shellfish Allergy     Reaction: unknown    Patient Measurements: Height: 5\' 7"  (170.2 cm) Weight: 80 lb (36.3 kg) IBW/kg (Calculated) : 61.6  Vital Signs: Temp: 100 F (37.8 C) (09/11 1541) Temp Source: Core (Comment) (09/11 1334) BP: 128/75 (09/11 1541) Pulse Rate: 88 (09/11 1541)  Labs:  Recent Labs  09/04/16 1001  WBC 16.6*  HGB 11.5*  PLT 359  CREATININE 0.78    Estimated Creatinine Clearance: 35.9 mL/min (by C-G formula based on SCr of 0.8 mg/dL).  No results for input(s): VANCOTROUGH, VANCOPEAK, VANCORANDOM, GENTTROUGH, GENTPEAK, GENTRANDOM, TOBRATROUGH, TOBRAPEAK, TOBRARND, AMIKACINPEAK, AMIKACINTROU, AMIKACIN in the last 72 hours.   Microbiology: No results found for this or any previous visit (from the past 720 hour(s)).  Medical History: Past Medical History:  Diagnosis Date  . Cervicalgia   . Constipation   . Coronary artery disease   . Dementia without behavioral disturbance   . Diabetes mellitus without complication ([place])   . Dysuria   . Glaucoma   . Hypertension   . Insomnia   . Memory loss   . Pressure ulcer of foot, stage 2   . Seizures ([place])   . Stroke ([place])   . Urinary incontinence   . Wheelchair dependence    Anti-infectives    Start     Dose/Rate Route Frequency Ordered Stop   09/04/16 1000  piperacillin-tazobactam (ZOSYN) IVPB 3.375 g     3.375 g 100 mL/hr over 30 Minutes Intravenous  Once 09/04/16 0948 09/04/16 1131   09/04/16 1000  vancomycin (VANCOCIN) IVPB 1000 mg/200 mL premix     1,000 mg 200 mL/hr over 60 Minutes Intravenous  Once 09/04/16 0948 09/04/16 1236     Assessment: 40yyo female.  Small body habitus.  Admitted via ED, asked to initiate Vancomycin and Zosyn for sepsis.    Goal of Therapy:  Vancomycin trough level 15-20 mcg/ml  Plan:  Preliminary review  of pertinent patient information completed.  Protocol will be initiated with a one-time dose(s) of Vancomycin 1000mg  and Zosyn 3.375gm IV x 1 Admission ABX orders signed and held.  Forestine Na clinical pharmacist will complete review during morning rounds to assess patient for potential changes.  Hart Robinsons A, RPH 09/04/2016,3:50 PM

## 2016-09-04 NOTE — H&P (Signed)
History and Physical    Debra Hoffman [id number] DOB: [birth year] DOA: 09/04/2016  PCP: Sabino Snipes KEY, MD Patient coming from: Hospice Home  Chief Complaint: Unresponsive  HPI: Debra Hoffman is a 75 y.o. female with medical history significant of necrotic toes and heel on the left foot, recent AKA of the right leg, malnutrition whom was admitted from the hospice home after being found unresponsive. Complicated familial situation.  Patient underwent right AKA a month and a half ago.  Between that time and present patient was discharged to hospice home as she had been diagnosed with failure to thrive.  She was admitted at the beginning of August with a maggot infestion and gangrene of the foot, at that discharge from [place] she was sent to the hospice home.  Patient was in her usual state of health until yesterday.  According to her POA she is able to talk.  Today she was unresponsive and was brought to [facility] ED for evaluation; at this time it is unclear what events transpired.   ED Course: Patient seen and evaluated by Dr. Wyvonnia Dusky. She found to be septic and was given aggressive IVF and started on broad spectrum antibiotics.  Dr. Wyvonnia Dusky did discuss this case with vascular surgeon at [facility] Dr. Oneida Alar who agreed to consult if patient were transferred to [facility].  Patient was seen and evaluated for transfer by the hospitalist team as well as Palliative Care.  Goals of care were discussed with patient's POA by myself and Quinn Axe.  POA states that patient has suffered enough.  She would like patient to have antibiotics and IVF and morphine as needed for pain.    Review of Systems: As per HPI otherwise 10 point review of systems negative.    Past Medical History:  Diagnosis Date  . Cervicalgia   . Constipation   . Coronary artery disease   . Dementia without behavioral disturbance   . Diabetes mellitus without complication ([place])   . Dysuria   . Glaucoma   .  Hypertension   . Insomnia   . Memory loss   . Pressure ulcer of foot, stage 2   . Seizures ([place])   . Stroke ([place])   . Urinary incontinence   . Wheelchair dependence     Past Surgical History:  Procedure Laterality Date  . AMPUTATION Right 07/23/2016   Procedure: AMPUTATION ABOVE KNEE;  Surgeon: [name], MD;  Location: [facility] ORS;  Service: Vascular;  Laterality: Right;  . CARDIAC SURGERY     coronary artery bypass graft  . CARDIAC SURGERY    . CORONARY ARTERY BYPASS GRAFT       reports that she has never smoked. She has never used smokeless tobacco. She reports that she does not drink alcohol or use drugs.  Allergies  Allergen Reactions  . Ativan [Lorazepam]   . Shellfish Allergy     Reaction: unknown    Family History  Problem Relation Age of Onset  . Heart disease Mother   . Heart disease Father      Prior to Admission medications   Medication Sig Start Date End Date Taking? Authorizing Provider  divalproex (DEPAKOTE SPRINKLE) 125 MG capsule Take 250 mg by mouth 2 (two) times daily.    Yes Historical Provider, MD  ferrous sulfate 220 (44 Fe) MG/5ML solution Take 220 mg by mouth 2 (two) times daily with a meal.   Yes Historical Provider, MD  haloperidol (HALDOL) 2 MG/ML solution Take 2  mg by mouth every 4 (four) hours as needed for agitation.   Yes Historical Provider, MD  hydrochlorothiazide (MICROZIDE) 12.5 MG capsule Take 12.5 mg by mouth daily.   Yes Historical Provider, MD  oxyCODONE (ROXICODONE INTENSOL) 20 MG/ML concentrated solution Take 5 mg by mouth every 2 (two) hours as needed for severe pain.   Yes Historical Provider, MD  potassium chloride (K-DUR,KLOR-CON) 10 MEQ tablet Take 10 mEq by mouth 2 (two) times daily.   Yes Historical Provider, MD  scopolamine (TRANSDERM-SCOP) 1 MG/3DAYS Place 1 patch onto the skin every 3 (three) days.   Yes Historical Provider, MD    Physical Exam: Vitals:   09/04/16 1230 09/04/16 1237 09/04/16 1334 09/04/16 1541    BP: 143/79  137/85 128/75  Pulse: 99  98 88  Resp: 25  25 23   Temp:  99.7 F (37.6 C) 99.9 F (37.7 C) 100 F (37.8 C)  TempSrc:  Other (Comment) Core (Comment)   SpO2: 98%  96% 95%  Weight:      Height:          Constitutional: moderate distress Vitals:   09/04/16 1230 09/04/16 1237 09/04/16 1334 09/04/16 1541  BP: 143/79  137/85 128/75  Pulse: 99  98 88  Resp: 25  25 23   Temp:  99.7 F (37.6 C) 99.9 F (37.7 C) 100 F (37.8 C)  TempSrc:  Other (Comment) Core (Comment)   SpO2: 98%  96% 95%  Weight:      Height:       Eyes: eyes closed, when lids lifted eyes did not focus ENMT: Mucous membranes are dry. Temporal wasting, agonal breathing   Neck: normal, supple, no masses, no thyromegaly Respiratory: decreased breath sounds bilaterally, rales in the left upper lobe and right upper lobe, No accessory muscle use.  Cardiovascular: tachycardic,  I/VI systolic murmur, no rubs / gallops.   Abdomen: no tenderness, no masses palpated. No hepatosplenomegaly. Bowel sounds positive.  Musculoskeletal: right AKA that is well healed, left foot with toe necrosis on 1st and 2nd digit and a heel eschar, no appreciable pulse in the left DP or PT Neurologic: unable to assess as patient is unresponsive Psychiatric: patient unresponsive.    Labs on Admission: I have personally reviewed following labs and imaging studies  CBC:  Recent Labs Lab 09/04/16 1001  WBC 16.6*  NEUTROABS 15.4*  HGB 11.5*  HCT 36.3  MCV 86.8  PLT [id number]   Basic Metabolic Panel:  Recent Labs Lab 09/04/16 1001  NA 139  K 4.4  CL 101  CO2 25  GLUCOSE 239*  BUN 31*  CREATININE 0.78  CALCIUM 9.4   GFR: Estimated Creatinine Clearance: 35.9 mL/min (by C-G formula based on SCr of 0.8 mg/dL). Liver Function Tests:  Recent Labs Lab 09/04/16 1001  AST 27  ALT 9*  ALKPHOS 75  BILITOT 0.7  PROT 7.2  ALBUMIN 2.5*   No results for input(s): LIPASE, AMYLASE in the last 168 hours. No results for  input(s): AMMONIA in the last 168 hours. Coagulation Profile: No results for input(s): INR, PROTIME in the last 168 hours. Cardiac Enzymes: No results for input(s): CKTOTAL, CKMB, CKMBINDEX, TROPONINI in the last 168 hours. BNP (last 3 results) No results for input(s): PROBNP in the last 8760 hours. HbA1C: No results for input(s): HGBA1C in the last 72 hours. CBG: No results for input(s): GLUCAP in the last 168 hours. Lipid Profile: No results for input(s): CHOL, HDL, LDLCALC, TRIG, CHOLHDL, LDLDIRECT in the last 72  hours. Thyroid Function Tests: No results for input(s): TSH, T4TOTAL, FREET4, T3FREE, THYROIDAB in the last 72 hours. Anemia Panel: No results for input(s): VITAMINB12, FOLATE, FERRITIN, TIBC, IRON, RETICCTPCT in the last 72 hours. Urine analysis:    Component Value Date/Time   COLORURINE YELLOW 09/04/2016 0947   APPEARANCEUR HAZY (A) 09/04/2016 0947   APPEARANCEUR Hazy 03/21/2015 1130   LABSPEC 1.025 09/04/2016 0947   LABSPEC 1.012 03/21/2015 1130   PHURINE 5.5 09/04/2016 0947   GLUCOSEU NEGATIVE 09/04/2016 0947   GLUCOSEU Negative 03/21/2015 1130   HGBUR NEGATIVE 09/04/2016 0947   BILIRUBINUR SMALL (A) 09/04/2016 0947   BILIRUBINUR Negative 03/21/2015 1130   KETONESUR TRACE (A) 09/04/2016 0947   PROTEINUR TRACE (A) 09/04/2016 0947   NITRITE NEGATIVE 09/04/2016 0947   LEUKOCYTESUR NEGATIVE 09/04/2016 0947   LEUKOCYTESUR 1+ 03/21/2015 1130   Sepsis Labs: !!!!!!!!!!!!!!!!!!!!!!!!!!!!!!!!!!!!!!!!!!!! @LABRCNTIP (procalcitonin:4,lacticidven:4) )No results found for this or any previous visit (from the past 240 hour(s)).   Radiological Exams on Admission: Dg Chest Port 1 View  Result Date: 09/04/2016 CLINICAL DATA:  Unresponsive, chest rattle EXAM: PORTABLE CHEST 1 VIEW COMPARISON:  10/25/2015 FINDINGS: Patchy left upper lobe/ lingular opacity, suspicious for pneumonia. Additional mild right upper lobe opacity. Possible mild blunting of the right costophrenic angle.  No pneumothorax. The heart is normal in size. Median sternotomy. IMPRESSION: Left upper lobe/ lingular pneumonia. Electronically Signed   By: Julian Hy M.D.   On: 09/04/2016 10:47    EKG: Independently reviewed. Sinus tachycardia, RBBB  Assessment/Plan Active Problems:   Acute encephalopathy   Leukocytosis   Dysphagia   Necrotic toes (HCC)   Palliative care encounter   DNR (do not resuscitate)   Aspiration pneumonia ([place])     Aspiration Pneumonia - Will continue Zosyn and Vancomycin per POA wishes - Pharmacy to dose - Supplemental oxygen per POA wishes - No heroic measures per POA wishes - may give morphine for air hunger and tachypnea per POA wishes  Acute encephalopathy - Patient is obtunded - ABG drawn in ED does not show hypercapnia - Septic - will offer IVF and antibiotics but POA states she does not want further interventions to prolong patients suffering  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 - unclear if this is contributing to patient's sepsis  Palliative Care Encounter - Discussed with Quinn Axe, palliative care NP - Aniceto Boss and I discussed case with [name], patient's POA - Patient's POA understands patient's illness and her recent decline - wants morphine for pain and to provide comfort as well as IVF and antibiotics - POA wants to keep DNR/ DNI and no pressor support  Social - very difficult and complicated family situation - [name] ask that only she be contacted as she is the [place] husband is to have surgery today - plan for POA to come to hospital tomorrow    DVT prophylaxis: None Code Status: DNR- DO NOT INTUBATE, NO CPR, NO PRESSORS Family Communication: Spoke with [name], patients granddaughter and POA at [phone], discussed with her and Quinn Axe goals of care, treatment options and plan.  Disposition Plan: Unclear at this time, extremely poor prognosis, guarded Consults called: EDP called Dr. Oneida Alar,  vascular surgeon at [facility] Admission status: Inpatient to step- down   Newman Pies MD [facility] Pager [phone]  If 7PM-7AM, please contact night-coverage [email or web address] Password [facility]  09/04/2016, 3:51 PM

## 2016-09-04 NOTE — ED Notes (Signed)
Per hospitalist Dr. Cristie Hem, states she has talked with Debra Hoffman and has agreed to admit to [facility].

## 2016-09-04 NOTE — ED Notes (Signed)
2nd lactid acid drawn at 1320. Lab stated they did not see  Order. Another order put in.

## 2016-09-04 NOTE — ED Notes (Signed)
Lactic Acid 3.8. EDP notified.
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
## 2016-09-05 MED ORDER — ENOXAPARIN SODIUM 30 MG/0.3ML ~~LOC~~ SOLN
30.0000 mg | SUBCUTANEOUS | Status: DC
  Administered 2016-09-06: 30 mg via SUBCUTANEOUS
  Filled 2016-09-05: qty 0.3
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
## 2016-09-05 NOTE — Progress Notes (Signed)
Patient responding to voice, will answer yes/no questions and can tell RN her name.

## 2016-09-05 NOTE — Progress Notes (Signed)
RN contacted by hospice RN for update. Would like for a call back when physicians talk with family about patient condition and the plan of care for the future. When asked why the patient was transported here RN stated the family demanded the patient be sent to the emergency room. Granddaughter was contacted to sign documents to revoke hospice, she refused.
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
## 2016-09-05 NOTE — Progress Notes (Signed)
Patient was resting. No visitors with her. Will follow up.
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
## 2016-09-05 NOTE — Progress Notes (Signed)
Report called to [facility]. Patient being transferred to 3rd floor room 36. No questions or concerns at this time.

## 2016-09-05 NOTE — Care Management Note (Signed)
Case Management Note  Patient Details  Name: Debra Hoffman MRN: [id number] Date of Birth: 12/08/1941  Subjective/Objective:                  Pt admitted with asp pneumonia. She was brought in from [place]. Per CSW her family is no longer interested in hospice and would like ot take her back to [place] with them when she discharges. Pt critically ill at this time.   Action/Plan: Will cont to follow for DC planning needs.   Expected Discharge Date:     09/08/2016             Expected Discharge Plan:  [place]  In-House Referral:  Clinical Social Work  Discharge planning Services  CM Consult  Post Acute Care Choice:    Choice offered to:  [facility] POA / Guardian  DME Arranged:    DME Agency:     HH Arranged:    [place] Agency:     Status of Service:  In process, will continue to follow  If discussed at Long Length of Stay Meetings, dates discussed:    Additional Comments:  Sherald Barge, RN 09/05/2016, 1:39 PM

## 2016-09-06 ENCOUNTER — Encounter (HOSPITAL_COMMUNITY): Payer: Self-pay | Admitting: Primary Care

## 2016-09-06 DIAGNOSIS — R1310 Dysphagia, unspecified: Secondary | ICD-10-CM

## 2016-09-06 LAB — BASIC METABOLIC PANEL
ANION GAP: 5 (ref 5–15)
ANION GAP: 6 (ref 5–15)
BUN: 27 mg/dL — ABNORMAL HIGH (ref 6–20)
BUN: 26 mg/dL — ABNORMAL HIGH (ref 6–20)
CHLORIDE: 114 mmol/L — AB (ref 101–111)
CHLORIDE: 114 mmol/L — AB (ref 101–111)
CO2: 25 mmol/L (ref 22–32)
CO2: 24 mmol/L (ref 22–32)
Calcium: 8.4 mg/dL — ABNORMAL LOW (ref 8.9–10.3)
Calcium: 8.5 mg/dL — ABNORMAL LOW (ref 8.9–10.3)
Creatinine, Ser: 0.38 mg/dL — ABNORMAL LOW (ref 0.44–1.00)
Creatinine, Ser: 0.38 mg/dL — ABNORMAL LOW (ref 0.44–1.00)
GFR calc non Af Amer: >60 mL/min (ref >60)
GFR calc non Af Amer: >60 mL/min (ref >60)
Glucose, Bld: 72 mg/dL (ref 65–99)
Glucose, Bld: 107 mg/dL — ABNORMAL HIGH (ref 65–99)
POTASSIUM: 2.6 mmol/L — AB (ref 3.5–5.1)
Potassium: 3.1 mmol/L — ABNORMAL LOW (ref 3.5–5.1)
SODIUM: 144 mmol/L (ref 135–145)
SODIUM: 144 mmol/L (ref 135–145)

## 2016-09-06 LAB — URINE CULTURE: Culture: NO GROWTH

## 2016-09-06 LAB — MAGNESIUM: MAGNESIUM: 1.9 mg/dL (ref 1.7–2.4)

## 2016-09-06 MED ORDER — SODIUM CHLORIDE 0.45 % IV SOLN
INTRAVENOUS | Status: DC
  Administered 2016-09-06: 18:00:00 via INTRAVENOUS
  Filled 2016-09-06 (×2): qty 1000

## 2016-09-06 MED ORDER — LEVOFLOXACIN 750 MG PO TABS
750.0000 mg | ORAL_TABLET | Freq: Every day | ORAL | Status: DC
  Administered 2016-09-06 – 2016-09-07 (×2): 750 mg via ORAL
  Filled 2016-09-06 (×2): qty 1

## 2016-09-06 MED ORDER — POTASSIUM CHLORIDE 10 MEQ/100ML IV SOLN
10.0000 meq | INTRAVENOUS | Status: AC
  Administered 2016-09-06 (×4): 10 meq via INTRAVENOUS
  Filled 2016-09-06 (×3): qty 100

## 2016-09-06 MED ORDER — VANCOMYCIN HCL 500 MG IV SOLR
INTRAVENOUS | Status: AC
  Filled 2016-09-06: qty 500

## 2016-09-06 MED ORDER — SODIUM CHLORIDE 0.45 % IV SOLN
INTRAVENOUS | Status: DC
  Administered 2016-09-06: 08:00:00 via INTRAVENOUS
  Filled 2016-09-06 (×4): qty 1000

## 2016-09-06 NOTE — Care Management Important Message (Signed)
Important Message  Patient Details  Name: Debra Hoffman MRN: [id number] Date of Birth: 03-12-1941   Medicare Important Message Given:  Yes    Mariana Goytia, Chauncey Reading, RN 09/06/2016, 3:03 PM
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
## 2016-09-06 NOTE — Progress Notes (Signed)
Daily Progress Note   Patient Name: Debra Hoffman       Date: 09/06/2016 DOB: Oct 01, 1941  Age: 75 y.o. MRN#: [id number] Attending Physician: Lavina Hamman, MD Primary Care Physician: Sabino Snipes KEY, MD Admit Date: 09/04/2016  Reason for Consultation/Follow-up: Disposition, Establishing goals of care and Psychosocial/spiritual support  Subjective: Debra Hoffman is resting quietly in bed. She speaks to me but does not open her eyes when I speak to her.  She does open her eyes briefly when I ask.  She tells me that her feet are hurting. She appears to be quite frail and ill. No family at bedside at this time. Call to granddaughter/healthcare power of attorney Leda Quail at [phone].  She tells me that her day has been "so horrible", and sounds as though she has been asleep when I call. I ask after her husband, and she states that she is "not trying to worry about him or anything else". She states again that she wants to be able to take her grandmother to [place]. I share that Debra Hoffman is somewhat improved, and that she will be ready for discharge tomorrow. I am unable to share my concerns over the rigors of such a long trip, as Debra Hoffman states that she is waiting on her 401(k) check and is unable to speak to me at this time. She requests that I call her mother, Debra Hoffman daughter, [name], and her sister [name] (works at this hospital as a Quarry manager) at [phone].   Call to granddaughter Shamika at [phone]. I share that Mrs. Drath is likely to discharge tomorrow. That her breathing is improved at this time. I share my conversation with Debra Hoffman. I ask [name] where she sees her grandmother discharging. She states that her grandmother is, "going back home". She  shares that she will have someone to stay with her 24/7, either daughter Debra Hoffman or herself.  I ask if she will return to [facility], and [place] states that they are expecting Debra Hoffman to return to Debra Hoffman and [place] home at [address] in [place].  I share our concerns that Debra Hoffman is continuing to aspirate, and that her time is not long.  I ask who will help care for her in the community, and  [name] states that she has a primary care doctor, Dr. Gwynneth Aliment.  I ask if [name] has seen her foot, and she states that she has and they have been told that the medical team is unable to do another operation.  She also shares that they feel hospice had been sedating Debra Hoffman, which led to the aspiration.  [name] states that she has been a CNA for 30 years.  I feel that she has the knowledge and experience to adequately care for her grandmother during this time.  She does share that they are desire is to continue with hospice services from [facility].   Length of Stay: 2  Current Medications: Scheduled Meds:  . enoxaparin (LOVENOX) injection  30 mg Subcutaneous Q24H  . piperacillin-tazobactam (ZOSYN)  IV  3.375 g Intravenous Q8H  . vancomycin  500 mg Intravenous Q24H    Continuous Infusions: . sodium chloride 0.45 % with kcl 75 mL/hr at 09/06/16 0811    PRN Meds: morphine CONCENTRATE  Physical Exam  Constitutional: No distress.  Frail and thin, chronically ill appearing  HENT:  Head: Normocephalic and atraumatic.  Temporal wasting, severe  Cardiovascular: Normal rate and regular rhythm.   Pulmonary/Chest: Effort normal. No respiratory distress.  Abdominal: Soft. She exhibits no distension.  Musculoskeletal:  Marked muscle wasting, severe temporal wasting.  Left 1st and 2nd toe black  Neurological:  Opens eyes when asked, does not keep eyes open. Oriented to person only at this time  Skin: Skin is warm and dry.            Vital Signs:  BP (!) 146/69 (BP Location: Left Arm)   Pulse (!) 106   Temp 98.3 F (36.8 C) (Oral)   Resp 15   Ht 5\' 9"  (1.753 m)   Wt 50 kg (110 lb 3.7 oz)   SpO2 94%   BMI 16.28 kg/m  SpO2: SpO2: 94 % O2 Device: O2 Device: NRB O2 Flow Rate: O2 Flow Rate (L/min): 15 L/min  Intake/output summary:  Intake/Output Summary (Last 24 hours) at 09/06/16 1414 Last data filed at 09/06/16 1200  Gross per 24 hour  Intake           476.25 ml  Output              400 ml  Net            76.25 ml   LBM: Last BM Date: 09/04/16 Baseline Weight: Weight: 36.3 kg (80 lb) Most recent weight: Weight: 50 kg (110 lb 3.7 oz)       Palliative Assessment/Data:    Flowsheet Rows   Flowsheet Row Most Recent Value  Intake Tab  Referral Department  -- [ED]  Unit at Time of Referral  ER  Palliative Care Primary Diagnosis  Sepsis/Infectious Disease  Date Notified  09/04/16  Palliative Care Type  New Palliative care  Reason for referral  Clarify Goals of Care, Counsel Regarding Hospice  Date of Admission  09/04/16  Date first seen by Palliative Care  09/04/16  # of days Palliative referral response time  0 Day(s)  # of days IP prior to Palliative referral  0  Clinical Assessment  Palliative Performance Scale Score  10%  Pain Max last 24 hours  Not able to report  Pain Min Last 24 hours  Not able to report  Dyspnea Max Last 24 Hours  Not able to report  Dyspnea Min Last 24 hours  Not able to report  Psychosocial &  Spiritual Assessment  Palliative Care Outcomes  Patient/Family meeting held?  Yes  Who was at the meeting?  Granddaughter Doctor, hospital via telephone  Palliative Care Outcomes  Provided advance care planning, Provided psychosocial or spiritual support, Clarified goals of care  Patient/Family wishes: Interventions discontinued/not started   Mechanical Ventilation  Palliative Care follow-up planned  -- [Follow-up while at [facility]]      Patient Active Problem List   Diagnosis Date Noted  . Aspiration  pneumonia ([place]) 09/04/2016  . Lower limb ischemia   . Sepsis ([place])   . Goals of care, counseling/discussion   . Palliative care encounter 07/26/2016  . DNR (do not resuscitate) 07/26/2016  . Protein-calorie malnutrition, severe 07/22/2016  . Necrotic toes ([place]) 07/21/2016  . Acute respiratory failure with hypoxia ([place]) 10/27/2015  . Acute encephalopathy 10/27/2015  . Renal insufficiency 10/27/2015  . Sterile pyuria 10/27/2015  . Diabetes mellitus ([place]) 10/27/2015  . Leukocytosis 10/27/2015  . Generalized weakness 10/27/2015  . Dysphagia 10/27/2015  . Elevated troponin   . Pneumonia 10/22/2015    Palliative Care Assessment & Plan   Patient Profile: 75 y.o. 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   Assessment: As above  Recommendations/Plan:  Return to home with the services of hospice of [place] and Additional Recommendations:  Limitations on Scope of Treatment: No extraordinary measures, home with [facility] [place] Status:    Code Status Orders        Start     Ordered   09/04/16 1815  Do not attempt resuscitation (DNR)  Continuous    Question Answer Comment  In the event of cardiac or respiratory ARREST Do not call a "code blue"   In the event of cardiac or respiratory ARREST Do not perform Intubation, CPR, defibrillation or ACLS   In the event of cardiac or respiratory ARREST Use medication by any route, position, wound care, and other measures to relive pain and suffering. May use oxygen, suction and manual treatment of airway obstruction as needed for comfort.      09/04/16 1815    Code Status History    Date Active Date Inactive Code Status Order ID Comments User Context   09/04/2016  5:10 PM 09/04/2016  6:15 PM DNR [id number]  Wallis Bamberg, MD Inpatient   09/04/2016  4:53 PM 09/04/2016  5:10 PM DNR [id number]  Wallis Bamberg, MD Inpatient     07/21/2016  4:33 PM 07/26/2016  5:49 PM DNR [id number]  Henreitta Leber, MD Inpatient   10/22/2015  9:56 PM 10/27/2015  9:15 PM Full Code [id number]  Theodoro Grist, MD ED    Advance Directive Documentation   Man Most Recent Value  Type of Advance Directive  Out of facility DNR (pink MOST or yellow form)  Pre-existing out of facility DNR order (yellow form or pink MOST form)  Yellow form placed in chart (order not valid for inpatient use)  "MOST" Form in Place?  No data       Prognosis:   < 6 weeks, Likely based on functional decline, 6 hospitalizations in the last 2 months, right AKA 1 month ago, left 1st and 2nd toes gangrenous, admitted with sepsis.  Discharge Planning:  [facility], to daughter [name] and granddaughter Shamika's home at [address] in [place] was discussed with nursing staff, case  Freight forwarder, Education officer, museum, and Dr. Posey Pronto.   Thank you for allowing the Palliative Medicine Team to assist in the care of this patient.   Time In: 1330 Time Out: 1420 Total Time 50 Minutes  Prolonged Time Billed  yes       Greater than 50%  of this time was spent counseling and coordinating care related to the above assessment and plan.  Drue Novel, NP  Please contact Palliative Medicine Team phone at [phone] for questions and concerns.

## 2016-09-06 NOTE — Progress Notes (Signed)
Pharmacy Antibiotic Note  Debra Hoffman is a 75 y.o. female admitted on 09/04/2016 with pneumonia.  Pharmacy has been consulted for Levaquin dosing.  Plan: Levaquin 750mg  po q24h F/U labs, cultures, and progress  Height: 5\' 9"  (175.3 cm) Weight: 110 lb 3.7 oz (50 kg) IBW/kg (Calculated) : 66.2  Temp (24hrs), Avg:97.9 F (36.6 C), Min:97.7 F (36.5 C), Max:98.3 F (36.8 C)   Recent Labs Lab 09/04/16 1001 09/04/16 1349 09/04/16 1634 09/04/16 2005 09/04/16 2306 09/06/16 0614  WBC 16.6*  --   --  15.6*  --   --   CREATININE 0.78  --   --  0.68  --  0.38*  LATICACIDVEN 3.8* 4.8* 4.0* 2.9* 1.4  --     Estimated Creatinine Clearance: 49.4 mL/min (by C-G formula based on SCr of 0.38 mg/dL (L)).    Allergies  Allergen Reactions  . Ativan [Lorazepam]   . Shellfish Allergy     Reaction: unknown    Antimicrobials this admission: Levaquin 9/13 >> Vancomycin 9/11 >> 9/13 Zosyn 9/11 >> 9/13  Dose adjustments this admission: n/a  Microbiology results: 9/11 BCx: ngtd 9/11 UCx: no growth 9/11 MRSA PCR: negative  Thank you for allowing pharmacy to be a part of this patient's care. Isac Sarna, BS [place], [place] Clinical Pharmacist Pager [phone] 09/06/2016 3:53 PM

## 2016-09-07 DIAGNOSIS — L8990 Pressure ulcer of unspecified site, unspecified stage: Secondary | ICD-10-CM | POA: Insufficient documentation

## 2016-09-07 MED ORDER — POTASSIUM CHLORIDE CRYS ER 20 MEQ PO TBCR
40.0000 meq | EXTENDED_RELEASE_TABLET | ORAL | Status: AC
  Administered 2016-09-07 (×2): 40 meq via ORAL
  Filled 2016-09-07 (×2): qty 2

## 2016-09-07 MED ORDER — LEVOFLOXACIN 750 MG PO TABS: 750.0000 mg | ORAL_TABLET | Freq: Every day | ORAL | 0 refills | Status: AC

## 2016-09-07 NOTE — Care Management Note (Addendum)
Case Management Note  Patient Details  Name: Debra Hoffman MRN: [id number] Date of Birth: [birth year]   Expected Discharge Date:          09/07/2016        Expected Discharge Plan:  [place]  In-House Referral:  Clinical Social Work  Discharge planning Services  CM Consult  Post Acute Care Choice:    Choice offered to:  [facility] POA / Guardian  DME Arranged:    DME Agency:     HH Arranged:    [place] Agency:     Status of Service:  In process, will continue to follow  If discussed at Long Length of Stay Meetings, dates discussed:    Additional Comments: Spoke with Debra Hoffman, [facility]) regarding discharge plan for Debra Hoffman. She seemed unaware that her sister, Debra Hoffman had planned to continue [facility] Hoffman services, however this is not an issue because Debra Hoffman has discharged the patient due to social issues and safety of their staff. She states that she did not want to continue services with Hoffman, she plans to take her grandmother to [place] early next week. She states that with family support and PCS aide, she plans for her grandmother to stay with her. She states Debra Hoffman, Debra Hoffman daughter is on her way up to the hospital to pick up Debra Hoffman, at [place] request. Debra Hoffman asks that we help her into the car and that she is able to get into a wheelchair and would have someone at the house to help her out of the car.   Debra Hoffman, Debra [place], RN 09/07/2016, 1:17 PM

## 2016-09-07 NOTE — Progress Notes (Signed)
Patient with orders to be discharge home. Discharge instructions given to daughter, verbalized understanding. Patient stable. Patient transported via daughter.

## 2016-09-07 NOTE — Discharge Instructions (Signed)
Foley Catheter Care, Adult °A Foley catheter is a soft, flexible tube that is placed into the bladder to drain urine. A Foley catheter may be inserted if: °· You leak urine or are not able to control when you urinate (urinary incontinence). °· You are not able to urinate when you need to (urinary retention). °· You had prostate surgery or surgery on the genitals. °· You have certain medical conditions, such as multiple sclerosis, dementia, or a spinal cord injury. °If you are going home with a Foley catheter in place, follow the instructions below. °TAKING CARE OF THE CATHETER °1. Wash your hands with soap and water. °2. Using mild soap and warm water on a clean washcloth: °¨ Clean the area on your body closest to the catheter insertion site using a circular motion, moving away from the catheter. Never wipe toward the catheter because this could sweep bacteria up into the urethra and cause infection. °¨ Remove all traces of soap. Pat the area dry with a clean towel. For males, reposition the foreskin. °3. Attach the catheter to your leg so there is no tension on the catheter. Use adhesive tape or a leg strap. If you are using adhesive tape, remove any sticky residue left behind by the previous tape you used. °4. Keep the drainage bag below the level of the bladder, but keep it off the floor. °5. Check throughout the day to be sure the catheter is working and urine is draining freely. Make sure the tubing does not become kinked. °6. Do not pull on the catheter or try to remove it. Pulling could damage internal tissues. °TAKING CARE OF THE DRAINAGE BAGS °You will be given two drainage bags to take home. One is a large overnight drainage bag, and the other is a smaller leg bag that fits underneath clothing. You may wear the overnight bag at any time, but you should never wear the smaller leg bag at night. Follow the instructions below for how to empty, change, and clean your drainage bags. °Emptying the Drainage  Bag °You must empty your drainage bag when it is  -½ full or at least 2-3 times a day. °1. Wash your hands with soap and water. °2. Keep the drainage bag below your hips, below the level of your bladder. This stops urine from going back into the tubing and into your bladder. °3. Hold the dirty bag over the toilet or a clean container. °4. Open the pour spout at the bottom of the bag and empty the urine into the toilet or container. Do not let the pour spout touch the toilet, container, or any other surface. Doing so can place bacteria on the bag, which can cause an infection. °5. Clean the pour spout with a gauze pad or cotton ball that has rubbing alcohol on it. °6. Close the pour spout. °7. Attach the bag to your leg with adhesive tape or a leg strap. °8. Wash your hands well. °Changing the Drainage Bag °Change your drainage bag once a month or sooner if it starts to smell bad or look dirty. Below are steps to follow when changing the drainage bag. °1. Wash your hands with soap and water. °2. Pinch off the rubber catheter so that urine does not spill out. °3. Disconnect the catheter tube from the drainage tube at the connection valve. Do not let the tubes touch any surface. °4. Clean the end of the catheter tube with an alcohol wipe. Use a different alcohol wipe to clean   the end of the drainage tube. °5. Connect the catheter tube to the drainage tube of the clean drainage bag. °6. Attach the new bag to the leg with adhesive tape or a leg strap. Avoid attaching the new bag too tightly. °7. Wash your hands well. °Cleaning the Drainage Bag °1. Wash your hands with soap and water. °2. Wash the bag in warm, soapy water. °3. Rinse the bag thoroughly with warm water. °4. Fill the bag with a solution of white vinegar and water (1 cup vinegar to 1 qt warm water [.2 L vinegar to 1 L warm water]). Close the bag and soak it for 30 minutes in the solution. °5. Rinse the bag with warm water. °6. Hang the bag to dry with the  pour spout open and hanging downward. °7. Store the clean bag (once it is dry) in a clean plastic bag. °8. Wash your hands well. °PREVENTING INFECTION °· Wash your hands before and after handling your catheter. °· Take showers daily and wash the area where the catheter enters your body. Do not take baths. Replace wet leg straps with dry ones, if this applies. °· Do not use powders, sprays, or lotions on the genital area. Only use creams, lotions, or ointments as directed by your caregiver. °· For females, wipe from front to back after each bowel movement. °· Drink enough fluids to keep your urine clear or pale yellow unless you have a fluid restriction. °· Do not let the drainage bag or tubing touch or lie on the floor. °· Wear cotton underwear to absorb moisture and to keep your skin drier. °SEEK MEDICAL CARE IF:  °· Your urine is cloudy or smells unusually bad. °· Your catheter becomes clogged. °· You are not draining urine into the bag or your bladder feels full. °· Your catheter starts to leak. °SEEK IMMEDIATE MEDICAL CARE IF:  °· You have pain, swelling, redness, or pus where the catheter enters the body. °· You have pain in the abdomen, legs, lower back, or bladder. °· You have a fever. °· You see blood fill the catheter, or your urine is pink or red. °· You have nausea, vomiting, or chills. °· Your catheter gets pulled out. °MAKE SURE YOU:  °· Understand these instructions. °· Will watch your condition. °· Will get help right away if you are not doing well or get worse. °  °This information is not intended to replace advice given to you by your health care provider. Make sure you discuss any questions you have with your health care provider. °  °Document Released: 12/11/2005 Document Revised: 04/27/2014 Document Reviewed: 12/02/2012 °Elsevier Interactive Patient Education ©2016 [employer] Inc[employer] ° °

## 2016-09-07 NOTE — Progress Notes (Signed)
Speech Language Pathology Treatment: Dysphagia  Patient Details Name: Debra Hoffman MRN: [id number] DOB: [birth year] Today's Date: 09/07/2016 Time: [id number] SLP Time Calculation (min) (ACUTE ONLY): 16 min  Assessment / Plan / Recommendation Clinical Impression  SLP saw pt/daughter for caregiver education and discharge needs. Daughter reports that her mother was on pureed foods and thickened liquids prior to admission. SLP reviewed safe swallow strategies, reflux precautions, and oral hygiene techniques. Written information provided as well as swabs for continued oral care. Daughter appreciative for visit and responded with appropriate questions. Continue D1/puree and NTL.   HPI HPI: 75 y.o. female with medical history significant of necrotic toes and heel on the left foot, recent AKA of the right leg, malnutrition whom was admitted from the hospice home after being found unresponsive. Complicated familial situation.  Patient underwent right AKA a month and a half ago.  Between that time and present patient was discharged to hospice home as she had been diagnosed with failure to thrive.  She was admitted at the beginning of August with a maggot infestion and gangrene of the foot, at that discharge from [place] she was sent to the hospice home.  Patient was in her usual state of health until yesterday.  According to her POA she is able to talk.  Today she was unresponsive and was brought to [facility] for evaluation; at this time it is unclear what events transpired. Chest x-ray shows left upper lobe lingular PNA.       SLP Plan  Discharge SLP treatment due to (comment) (pt discharging home with family)     Recommendations  Diet recommendations: Dysphagia 1 (puree);Nectar-thick liquid Liquids provided via: Cup;Straw Medication Administration: Crushed with puree Supervision: Full supervision/cueing for compensatory strategies Compensations: Minimize environmental distractions;Slow rate;Small  sips/bites;Follow solids with liquid;Effortful swallow Postural Changes and/or Swallow Maneuvers: Seated upright 90 degrees             Oral Care Recommendations: Oral care BID;Oral care before and after PO Follow up Recommendations: 24 hour supervision/assistance Plan: Discharge SLP treatment due to (comment) (pt discharging home with family)     Thank you,  Genene Churn, [place]                 [place] 09/07/2016, 4:06 PM

## 2016-09-07 NOTE — Clinical Social Work Note (Signed)
CSW received update from Beth with [facility], who advised that patient was being discharged from their agency due to safety concerns.     Aurorah Schlachter, Clydene Pugh, LCSW

## 2016-09-07 NOTE — Progress Notes (Signed)
Daily Progress Note   Patient Name: Debra Hoffman       Date: 09/07/2016 DOB: 01-31-1941  Age: 75 y.o. MRN#: [id number] Attending Physician: Lavina Hamman, MD Primary Care Physician: Sabino Snipes KEY, MD Admit Date: 09/04/2016  Reason for Consultation/Follow-up: Disposition, Establishing goals of care, Hospice Evaluation and Psychosocial/spiritual support  Subjective: Debra Hoffman is resting quietly in bed, she speaks to me but does not open her eyes. There is no family at bedside at this time.  Debra Hoffman denies pain at this time, she is able to take container of orange juice and a few sips of water for me. No overt signs and symptoms of aspiration. Chaplain Joya Gaskins arrives and we support Debra Hoffman in this difficult time.  In order to decrease the burden on family, Darrick Grinder, disposition phone calls are coordinated with SW and CM.  Debra Hoffman has been declined by [facility] to to safety concerns. HCPOA [name] declines further Hospice referrals and continues to state that she will drive Debra Hoffman to [place] on Monday.    Length of Stay: 3  Current Medications: Scheduled Meds:  . enoxaparin (LOVENOX) injection  30 mg Subcutaneous Q24H  . levofloxacin  750 mg Oral Daily    Continuous Infusions:    PRN Meds: morphine CONCENTRATE  Physical Exam  Constitutional: No distress.  Frail and thin, opens eyes only to request  HENT:  Head: Normocephalic and atraumatic.  Severe temporal wasting  Cardiovascular: Normal rate.   Pulmonary/Chest: Effort normal. No respiratory distress.  Abdominal: Soft. There is no guarding.  Musculoskeletal:  muscle wasting, severe temporal wasting right AKA, left 1st and 2nd toes black, gangrenous  Neurological:  Open  eyes to command only  Skin: Skin is warm and dry.  Nursing note and vitals reviewed.           Vital Signs: BP (!) 157/78 (BP Location: Left Arm)   Pulse 79   Temp 98.5 F (36.9 C) (Oral)   Resp 20   Ht 5\' 9"  (1.753 m)   Wt 50 kg (110 lb 3.7 oz)   SpO2 100%   BMI 16.28 kg/m  SpO2: SpO2: 100 % O2 Device: O2 Device: Nasal Cannula O2 Flow Rate: O2 Flow Rate (L/min): 15 L/min  Intake/output summary:  Intake/Output Summary (Last 24 hours) at 09/07/16  [place] filed at 09/07/16 0501  Gross per 24 hour  Intake           951.25 ml  Output              551 ml  Net           400.25 ml   LBM: Last BM Date: 09/06/16 Baseline Weight: Weight: 36.3 kg (80 lb) Most recent weight: Weight: 50 kg (110 lb 3.7 oz)       Palliative Assessment/Data:    Flowsheet Rows   Flowsheet Row Most Recent Value  Intake Tab  Referral Department  -- [ED]  Unit at Time of Referral  ER  Palliative Care Primary Diagnosis  Sepsis/Infectious Disease  Date Notified  09/04/16  Palliative Care Type  New Palliative care  Reason for referral  Clarify Goals of Care, Counsel Regarding Hospice  Date of Admission  09/04/16  Date first seen by Palliative Care  09/04/16  # of days Palliative referral response time  0 Day(s)  # of days IP prior to Palliative referral  0  Clinical Assessment  Palliative Performance Scale Score  10%  Pain Max last 24 hours  Not able to report  Pain Min Last 24 hours  Not able to report  Dyspnea Max Last 24 Hours  Not able to report  Dyspnea Min Last 24 hours  Not able to report  Psychosocial & Spiritual Assessment  Palliative Care Outcomes  Patient/Family meeting held?  Yes  Who was at the meeting?  Granddaughter Doctor, hospital via telephone  Palliative Care Outcomes  Provided advance care planning, Provided psychosocial or spiritual support, Clarified goals of care  Patient/Family wishes: Interventions discontinued/not started   Mechanical Ventilation  Palliative Care  follow-up planned  -- [Follow-up while at [facility]]      Patient Active Problem List   Diagnosis Date Noted  . Pressure ulcer 09/07/2016  . Aspiration pneumonia ([place]) 09/04/2016  . Lower limb ischemia   . Sepsis ([place])   . Goals of care, counseling/discussion   . Palliative care encounter 07/26/2016  . DNR (do not resuscitate) 07/26/2016  . Protein-calorie malnutrition, severe 07/22/2016  . Necrotic toes ([place]) 07/21/2016  . Acute respiratory failure with hypoxia ([place]) 10/27/2015  . Acute encephalopathy 10/27/2015  . Renal insufficiency 10/27/2015  . Sterile pyuria 10/27/2015  . Diabetes mellitus ([place]) 10/27/2015  . Leukocytosis 10/27/2015  . Generalized weakness 10/27/2015  . Dysphagia 10/27/2015  . Elevated troponin   . 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   Assessment: as above  Recommendations/Plan:  Home with NO IN HOME SERVICES. I worry for Debra Hoffman pain management.   Goals of Care and Additional Recommendations:  Limitations on Scope of Treatment: NO CPR or intubation, not a candidate for surgery  Code Status:    Code Status Orders        Start     Ordered   09/04/16 1815  Do not attempt resuscitation (DNR)  Continuous    Question Answer Comment  In the event of cardiac or respiratory ARREST Do not call a "code blue"   In the event of cardiac or respiratory ARREST Do not perform Intubation, CPR, defibrillation or ACLS   In the event of cardiac or respiratory ARREST Use medication by any route, position, wound care, and other measures to relive pain  and suffering. May use oxygen, suction and manual treatment of airway obstruction as needed for comfort.      09/04/16 1815    Code Status History    Date Active Date Inactive Code Status Order ID Comments User Context   09/04/2016  5:10 PM 09/04/2016   6:15 PM DNR [id number]  Wallis Bamberg, MD Inpatient   09/04/2016  4:53 PM 09/04/2016  5:10 PM DNR [id number]  Wallis Bamberg, MD Inpatient   07/21/2016  4:33 PM 07/26/2016  5:49 PM DNR [id number]  Henreitta Leber, MD Inpatient   10/22/2015  9:56 PM 10/27/2015  9:15 PM Full Code [id number]  Theodoro Grist, MD ED    Advance Directive Documentation   [place] Most Recent Value  Type of Advance Directive  Out of facility DNR (pink MOST or yellow form)  Pre-existing out of facility DNR order (yellow form or pink MOST form)  Yellow form placed in chart (order not valid for inpatient use)  "MOST" Form in Place?  No data       Prognosis:   < 6 weeks, likely dt necrotic toes, frailty, failure to thrive.   Discharge Planning:  Home without services, goal for HCPOA to drive Debra Hoffman to [place] on Monday.   Care plan was discussed with nursing staff, CM, SW, and Dr. Posey Pronto.   Thank you for allowing the Palliative Medicine Team to assist in the care of this patient.   Time In: 1120 Time Out: 1155 Total Time 35 minutes Prolonged Time Billed  no       Greater than 50%  of this time was spent counseling and coordinating care related to the above assessment and plan.  Drue Novel, NP  Please contact Palliative Medicine Team phone at [phone] for questions and concerns.
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
## 2016-09-11 LAB — CULTURE, BLOOD (ROUTINE X 2)
Culture: NO GROWTH
Culture: NO GROWTH

## 2016-09-23 ENCOUNTER — Other Ambulatory Visit: Payer: Self-pay

## 2016-09-23 ENCOUNTER — Inpatient Hospital Stay (HOSPITAL_COMMUNITY)
Admission: EM | Admit: 2016-09-23 | Discharge: 2016-09-27 | DRG: 871 | Disposition: A | Discharge status: Home / Self Care | Payer: Medicare Other | Attending: Internal Medicine | Admitting: Internal Medicine

## 2016-09-23 ENCOUNTER — Emergency Department (HOSPITAL_COMMUNITY): Payer: Medicare Other

## 2016-09-23 ENCOUNTER — Encounter (HOSPITAL_COMMUNITY): Payer: Self-pay

## 2016-09-23 DIAGNOSIS — G300 Alzheimer's disease with early onset: Secondary | ICD-10-CM | POA: Diagnosis not present

## 2016-09-23 DIAGNOSIS — Z951 Presence of aortocoronary bypass graft: Secondary | ICD-10-CM

## 2016-09-23 DIAGNOSIS — H409 Unspecified glaucoma: Secondary | ICD-10-CM | POA: Diagnosis present

## 2016-09-23 DIAGNOSIS — A419 Sepsis, unspecified organism: Secondary | ICD-10-CM | POA: Diagnosis not present

## 2016-09-23 DIAGNOSIS — Z515 Encounter for palliative care: Secondary | ICD-10-CM | POA: Diagnosis present

## 2016-09-23 DIAGNOSIS — I2510 Atherosclerotic heart disease of native coronary artery without angina pectoris: Secondary | ICD-10-CM | POA: Diagnosis present

## 2016-09-23 DIAGNOSIS — Z993 Dependence on wheelchair: Secondary | ICD-10-CM | POA: Diagnosis not present

## 2016-09-23 DIAGNOSIS — Z8249 Family history of ischemic heart disease and other diseases of the circulatory system: Secondary | ICD-10-CM

## 2016-09-23 DIAGNOSIS — F0390 Unspecified dementia without behavioral disturbance: Secondary | ICD-10-CM | POA: Diagnosis not present

## 2016-09-23 DIAGNOSIS — F0150 Vascular dementia without behavioral disturbance: Secondary | ICD-10-CM | POA: Diagnosis not present

## 2016-09-23 DIAGNOSIS — R569 Unspecified convulsions: Secondary | ICD-10-CM | POA: Diagnosis present

## 2016-09-23 DIAGNOSIS — Z89611 Acquired absence of right leg above knee: Secondary | ICD-10-CM | POA: Diagnosis not present

## 2016-09-23 DIAGNOSIS — Z79899 Other long term (current) drug therapy: Secondary | ICD-10-CM | POA: Diagnosis not present

## 2016-09-23 DIAGNOSIS — Z681 Body mass index (BMI) 19 or less, adult: Secondary | ICD-10-CM

## 2016-09-23 DIAGNOSIS — I10 Essential (primary) hypertension: Secondary | ICD-10-CM | POA: Diagnosis present

## 2016-09-23 DIAGNOSIS — Z7189 Other specified counseling: Secondary | ICD-10-CM | POA: Diagnosis not present

## 2016-09-23 DIAGNOSIS — Z888 Allergy status to other drugs, medicaments and biological substances status: Secondary | ICD-10-CM

## 2016-09-23 DIAGNOSIS — E43 Unspecified severe protein-calorie malnutrition: Secondary | ICD-10-CM | POA: Diagnosis present

## 2016-09-23 DIAGNOSIS — Z91013 Allergy to seafood: Secondary | ICD-10-CM

## 2016-09-23 DIAGNOSIS — N390 Urinary tract infection, site not specified: Secondary | ICD-10-CM | POA: Diagnosis present

## 2016-09-23 DIAGNOSIS — I998 Other disorder of circulatory system: Secondary | ICD-10-CM | POA: Diagnosis present

## 2016-09-23 DIAGNOSIS — Z8673 Personal history of transient ischemic attack (TIA), and cerebral infarction without residual deficits: Secondary | ICD-10-CM

## 2016-09-23 DIAGNOSIS — E1152 Type 2 diabetes mellitus with diabetic peripheral angiopathy with gangrene: Secondary | ICD-10-CM | POA: Diagnosis present

## 2016-09-23 DIAGNOSIS — Z66 Do not resuscitate: Secondary | ICD-10-CM | POA: Diagnosis present

## 2016-09-23 DIAGNOSIS — I639 Cerebral infarction, unspecified: Secondary | ICD-10-CM | POA: Diagnosis present

## 2016-09-23 DIAGNOSIS — J189 Pneumonia, unspecified organism: Secondary | ICD-10-CM | POA: Diagnosis present

## 2016-09-23 DIAGNOSIS — F0280 Dementia in other diseases classified elsewhere without behavioral disturbance: Secondary | ICD-10-CM | POA: Diagnosis not present

## 2016-09-23 HISTORY — DX: Chronic kidney disease, unspecified: N18.9

## 2016-09-23 LAB — CBC WITH DIFFERENTIAL/PLATELET
BASOS ABS: 0 10*3/uL (ref 0.0–0.1)
BASOS PCT: 0 %
EOS PCT: 0 %
Eosinophils Absolute: 0 10*3/uL (ref 0.0–0.7)
HCT: 32.7 % — ABNORMAL LOW (ref 36.0–46.0)
Hemoglobin: 10 g/dL — ABNORMAL LOW (ref 12.0–15.0)
LYMPHS PCT: 9 %
Lymphs Abs: 1.2 10*3/uL (ref 0.7–4.0)
MCH: 25.4 pg — ABNORMAL LOW (ref 26.0–34.0)
MCHC: 30.6 g/dL (ref 30.0–36.0)
MCV: 83.2 fL (ref 78.0–100.0)
MONO ABS: 0.3 10*3/uL (ref 0.1–1.0)
Monocytes Relative: 3 %
Neutro Abs: 11.1 10*3/uL — ABNORMAL HIGH (ref 1.7–7.7)
Neutrophils Relative %: 88 %
PLATELETS: 328 10*3/uL (ref 150–400)
RBC: 3.93 MIL/uL (ref 3.87–5.11)
RDW: 16.1 % — AB (ref 11.5–15.5)
WBC: 12.6 10*3/uL — ABNORMAL HIGH (ref 4.0–10.5)

## 2016-09-23 LAB — COMPREHENSIVE METABOLIC PANEL
ALBUMIN: 1.9 g/dL — AB (ref 3.5–5.0)
ALK PHOS: 69 U/L (ref 38–126)
ALT: 6 U/L — AB (ref 14–54)
AST: 13 U/L — ABNORMAL LOW (ref 15–41)
Anion gap: 5 (ref 5–15)
BILIRUBIN TOTAL: 1.1 mg/dL (ref 0.3–1.2)
BUN: 25 mg/dL — AB (ref 6–20)
CALCIUM: 8.7 mg/dL — AB (ref 8.9–10.3)
CO2: 25 mmol/L (ref 22–32)
CREATININE: 0.33 mg/dL — AB (ref 0.44–1.00)
Chloride: 115 mmol/L — ABNORMAL HIGH (ref 101–111)
GFR calc Af Amer: >60 mL/min (ref >60)
GFR calc non Af Amer: >60 mL/min (ref >60)
GLUCOSE: 120 mg/dL — AB (ref 65–99)
Potassium: 2.9 mmol/L — ABNORMAL LOW (ref 3.5–5.1)
SODIUM: 145 mmol/L (ref 135–145)
TOTAL PROTEIN: 6.6 g/dL (ref 6.5–8.1)

## 2016-09-23 LAB — URINE MICROSCOPIC-ADD ON

## 2016-09-23 LAB — URINALYSIS, ROUTINE W REFLEX MICROSCOPIC
Glucose, UA: 100 mg/dL — AB
KETONES UR: NEGATIVE mg/dL
NITRITE: NEGATIVE
PH: 6 (ref 5.0–8.0)
Protein, ur: 100 mg/dL — AB
SPECIFIC GRAVITY, URINE: 1.025 (ref 1.005–1.030)

## 2016-09-23 LAB — I-STAT CG4 LACTIC ACID, ED: Lactic Acid, Venous: 1.62 mmol/L (ref 0.5–1.9)

## 2016-09-23 MED ORDER — PIPERACILLIN-TAZOBACTAM 3.375 G IVPB
3.3750 g | Freq: Three times a day (TID) | INTRAVENOUS | Status: DC
Start: 2016-09-23 — End: 2016-09-26
  Administered 2016-09-23 – 2016-09-26 (×8): 3.375 g via INTRAVENOUS
  Filled 2016-09-23 (×7): qty 50

## 2016-09-23 MED ORDER — SODIUM CHLORIDE 0.9 % IV BOLUS (SEPSIS)
1000.0000 mL | Freq: Once | INTRAVENOUS | Status: AC
  Administered 2016-09-23: 1000 mL via INTRAVENOUS

## 2016-09-23 MED ORDER — PIPERACILLIN-TAZOBACTAM 3.375 G IVPB 30 MIN: 3.3750 g | Freq: Once | INTRAVENOUS | Status: DC

## 2016-09-23 MED ORDER — VANCOMYCIN HCL IN DEXTROSE 1-5 GM/200ML-% IV SOLN
1000.0000 mg | Freq: Once | INTRAVENOUS | Status: AC
  Administered 2016-09-23: 1000 mg via INTRAVENOUS
  Filled 2016-09-23: qty 200

## 2016-09-23 MED ORDER — POTASSIUM CHLORIDE 10 MEQ/100ML IV SOLN
10.0000 meq | Freq: Once | INTRAVENOUS | Status: AC
  Administered 2016-09-23: 10 meq via INTRAVENOUS
  Filled 2016-09-23: qty 100

## 2016-09-23 MED ORDER — SODIUM CHLORIDE 0.9 % IV BOLUS (SEPSIS)
250.0000 mL | Freq: Once | INTRAVENOUS | Status: AC
  Administered 2016-09-23: 250 mL via INTRAVENOUS

## 2016-09-23 MED ORDER — VANCOMYCIN HCL IN DEXTROSE 750-5 MG/150ML-% IV SOLN
750.0000 mg | INTRAVENOUS | Status: DC
Start: 2016-09-24 — End: 2016-09-26
  Administered 2016-09-24 – 2016-09-25 (×2): 750 mg via INTRAVENOUS
  Filled 2016-09-23 (×3): qty 150

## 2016-09-23 NOTE — ED Triage Notes (Signed)
Patient here from home via [facility]. Patient will respond to pain/verbal response. EMS states they were called out for unresponsive that started approx. 2000. CBG 146.

## 2016-09-23 NOTE — ED Provider Notes (Signed)
Emergency Department Provider Note  By signing my name below, I, Ephriam Jenkins, attest that this documentation has been prepared under the direction and in the presence of Orvan Falconer, MD. Electronically signed, Ephriam Jenkins, ED Scribe. 09/23/16. 10:27 PM.  I have reviewed the triage vital signs and the nursing notes.  HISTORY  Chief Complaint Altered Mental Status  HPI: LEVEL 5 CAVEAT DUE TO ALTERED MENTAL STATUS Debra Hoffman is a 75 y.o. female, with a Hx of DM, Stroke, CAD, Sepsis, who presents to the Emergency Department unresponsive to pain/verbal stimuli that started at approximately 2000. Pt brought in by [facility]. Per family, pt was her normal self earlier today and all of a sudden started not responding to stimuli. Earlier today pt was talking normally and acting normally. She was discharged from the hospital 2 weeks ago for Sepsis due to unspecified source. She was discharged with two pills of abx. Pt is still currently not arousable to stimuli. Family confirmed DNR/DNI.    Past Medical History:  Diagnosis Date  . Cervicalgia   . Chronic kidney disease   . Constipation   . Coronary artery disease   . Dementia without behavioral disturbance   . Diabetes mellitus without complication ([place])   . Dysuria   . Glaucoma   . Hypertension   . Insomnia   . Memory loss   . Pressure ulcer of foot, stage 2   . Seizures ([place])   . Stroke ([place])   . Urinary incontinence   . Wheelchair dependence     Patient Active Problem List   Diagnosis Date Noted  . UTI (lower urinary tract infection) 09/24/2016  . Stroke ([place])   . Dementia without behavioral disturbance   . Pressure ulcer 09/07/2016  . Aspiration pneumonia ([place]) 09/04/2016  . Lower limb ischemia   . Sepsis ([place])   . Goals of care, counseling/discussion   . Palliative care encounter 07/26/2016  . DNR (do not resuscitate) 07/26/2016  . Protein-calorie malnutrition, severe 07/22/2016  . Necrotic toes ([place]) 07/21/2016  . Acute  respiratory failure with hypoxia ([place]) 10/27/2015  . Acute encephalopathy 10/27/2015  . Renal insufficiency 10/27/2015  . Sterile pyuria 10/27/2015  . Diabetes mellitus ([place]) 10/27/2015  . Leukocytosis 10/27/2015  . Generalized weakness 10/27/2015  . Dysphagia 10/27/2015  . Elevated troponin   . Pneumonia 10/22/2015    Past Surgical History:  Procedure Laterality Date  . AMPUTATION Right 07/23/2016   Procedure: AMPUTATION ABOVE KNEE;  Surgeon: [name], MD;  Location: [facility] ORS;  Service: Vascular;  Laterality: Right;  . CARDIAC SURGERY     coronary artery bypass graft  . CARDIAC SURGERY    . CORONARY ARTERY BYPASS GRAFT        Allergies Ativan [lorazepam] and Shellfish allergy  Family History  Problem Relation Age of Onset  . Heart disease Mother   . Heart disease Father     Social History Social History  Substance Use Topics  . Smoking status: Never Smoker  . Smokeless tobacco: Never Used  . Alcohol use No    Review of Systems: ROS UNOBTAINABLE DUE TO AMS  Limited by AMS.   ____________________________________________   PHYSICAL EXAM:  VITAL SIGNS: ED Triage Vitals  Enc Vitals Group     BP 09/23/16 2202 91/66     Pulse Rate 09/23/16 2157 114     Resp 09/23/16 2157 20     Temp 09/23/16 2157 100.4 F (38 C)     Temp Source 09/23/16 2157  Rectal     SpO2 09/23/16 2157 99 %     Weight 09/23/16 2200 90 lb (40.8 kg)     Height 09/23/16 2200 5\' 6"  (1.676 m)   Constitutional: Somnolent and minimally responsive.  Eyes: Conjunctivae are normal. PERRL. Head: Atraumatic. Nose: No congestion/rhinnorhea. Mouth/Throat: Mucous membranes are very dry. Oropharynx non-erythematous. Neck: No stridor.   Cardiovascular: Sinus tachycardia. Good peripheral circulation. Grossly normal heart sounds.   Respiratory: Normal respiratory effort.  No retractions. Lungs CTAB. Gastrointestinal: Soft and nontender. No distention.  Musculoskeletal: Right AKA. LLE with dry  gangrene of the 1st-4th toes and dark discoloration of the heel. No surrounding erythema or purulent drainage.  Neurologic:  Somnolent with contracted Debra.  Skin:  Skin is warm and dry.   ____________________________________________   LABS (all labs ordered are listed, but only abnormal results are displayed)  Labs Reviewed  URINALYSIS, ROUTINE W REFLEX MICROSCOPIC (NOT AT [facility]) - Abnormal; Notable for the following:       Result Value   Color, Urine AMBER (*)    APPearance CLOUDY (*)    Glucose, UA 100 (*)    Hgb urine dipstick LARGE (*)    Bilirubin Urine SMALL (*)    Protein, ur 100 (*)    Leukocytes, UA MODERATE (*)    All other components within normal limits  COMPREHENSIVE METABOLIC PANEL - Abnormal; Notable for the following:    Potassium 2.9 (*)    Chloride 115 (*)    Glucose, Bld 120 (*)    BUN 25 (*)    Creatinine, Ser 0.33 (*)    Calcium 8.7 (*)    Albumin 1.9 (*)    AST 13 (*)    ALT 6 (*)    All other components within normal limits  CBC WITH DIFFERENTIAL/PLATELET - Abnormal; Notable for the following:    WBC 12.6 (*)    Hemoglobin 10.0 (*)    HCT 32.7 (*)    MCH 25.4 (*)    RDW 16.1 (*)    Neutro Abs 11.1 (*)    All other components within normal limits  URINE MICROSCOPIC-ADD ON - Abnormal; Notable for the following:    Squamous Epithelial / LPF TOO NUMEROUS TO COUNT (*)    Bacteria, UA MANY (*)    All other components within normal limits  BASIC METABOLIC PANEL - Abnormal; Notable for the following:    Potassium 2.7 (*)    Chloride 115 (*)    Glucose, Bld 108 (*)    BUN 22 (*)    Creatinine, Ser <0.30 (*)    Calcium 8.2 (*)    All other components within normal limits  CBC - Abnormal; Notable for the following:    WBC 15.1 (*)    RBC 3.65 (*)    Hemoglobin 9.2 (*)    HCT 30.4 (*)    MCH 25.2 (*)    RDW 16.2 (*)    All other components within normal limits  CULTURE, BLOOD (ROUTINE X 2)  CULTURE, BLOOD (ROUTINE X 2)  URINE CULTURE  I-STAT CG4  LACTIC ACID, ED   ____________________________________________  RADIOLOGY  Ct Head Wo Contrast  Result Date: 09/23/2016 CLINICAL DATA:  Altered mental status. Fever. Unresponsive since 2000 hours. EXAM: CT HEAD WITHOUT CONTRAST TECHNIQUE: Contiguous axial images were obtained from the base of the skull through the vertex without intravenous contrast. COMPARISON:  MRI brain 10/26/2015.  CT head 10/25/2015. FINDINGS: Brain: Diffuse bilateral cerebral atrophy. Prominent CSF spaces along the frontoparietal  convexity bilaterally, likely representing subdural hygromas. No change since prior study. Mild ventricular dilatation consistent with central atrophy. Low-attenuation changes throughout the deep white matter consistent with small vessel ischemia. No mass effect or midline shift. No abnormal extra-axial fluid collections. Gray-white matter junctions are distinct. Basal cisterns are not effaced. No evidence of acute intracranial hemorrhage. Vascular: Atherosclerotic vascular calcifications are present. Skull: Normal. Negative for fracture or focal lesion. Sinuses/Orbits: Retention cysts in the sphenoid sinuses and left maxillary antrum. No acute air-fluid levels in the paranasal sinuses. Mastoid air cells are not opacified. Other: No acute changes since previous study. IMPRESSION: No acute intracranial abnormalities. Chronic atrophy and small vessel ischemic changes. Chronic bilateral subdural hygromas. Electronically Signed   By: Lucienne Capers M.D.   On: 09/23/2016 23:29   Dg Chest Port 1 View  Result Date: 09/23/2016 CLINICAL DATA:  Acute onset of fever and unresponsiveness. Code sepsis. Initial encounter. EXAM: PORTABLE CHEST 1 VIEW COMPARISON:  Chest radiograph from 09/04/2016 FINDINGS: The lungs are well-aerated. Minimal left basilar opacity may reflect atelectasis or mild infection, depending on the patient's symptoms. There is no evidence of pleural effusion or pneumothorax. The cardiomediastinal  silhouette is normal in size. The patient is status post median sternotomy. No acute osseous abnormalities are seen. IMPRESSION: Minimal left basilar opacity may reflect atelectasis or possibly mild infection, depending on the patient's symptoms. Electronically Signed   By: Garald Balding M.D.   On: 09/23/2016 22:48    ____________________________________________   PROCEDURES  Procedure(s) performed:   Procedures  CRITICAL CARE Performed by: Margette Fast Total critical care time: 35 minutes Critical care time was exclusive of separately billable procedures and treating other patients. Critical care was necessary to treat or prevent imminent or life-threatening deterioration. Critical care was time spent personally by me on the following activities: development of treatment plan with patient and/or surrogate as well as nursing, discussions with consultants, evaluation of patient's response to treatment, examination of patient, obtaining history from patient or surrogate, ordering and performing treatments and interventions, ordering and review of laboratory studies, ordering and review of radiographic studies, pulse oximetry and re-evaluation of patient's condition.  Patient with initial hypotension and low GCS. Sepsis protocol with multiple IVF fluid boluses, multiple abx, and frequent reassessment required.   Nanda Quinton, MD Emergency Medicine  ____________________________________________   INITIAL IMPRESSION / ASSESSMENT AND PLAN / ED COURSE  Pertinent labs & imaging results that were available during my care of the patient were reviewed by me and considered in my medical decision making (see chart for details).  DIAGNOSTIC STUDIES: Oxygen Saturation is 99% on RA, normal by my interpretation.  COORDINATION OF CARE: 10:20 PM-Will order lab work. Discussed treatment plan with pt at bedside and pt agreed to plan.   Patient presents femur to department for evaluation of acute  onset altered mental status with fever on presentation with sinus tachycardia and hypotension. The patient has multiple potential sources for infection including indwelling Foley catheter and lower extremity wounds. Family confirms that the patient is DNR/DNI and will be made comfortable. They are okay with the investigating for a source of infection, IV fluids, antibiotics as needed. Plan for CT scan of the head with apparent sudden onset of altered mental status to rule out bleed. Low suspicion of stroke with fever and other SIRS criteria. We'll initiate broad-spectrum antibiotics and activate CODE SEPSIS.   11:21 PM Patient with improving tachycardia and hypotension after IVF. Updated family. Patient is more responsive at this time.  Discussed patient's case with hospitalist, Dr. Shanon Brow.  Recommend admission to inpatient, med-surg bed.  I will place holding orders per their request. Patient and family (if present) updated with plan. Care transferred to hospitalist service.  I reviewed all nursing notes, vitals, pertinent old records, EKGs, labs, imaging (as available).  ___________________________________________  FINAL CLINICAL IMPRESSION(S) / ED DIAGNOSES  Final diagnoses:  Sepsis, due to unspecified organism [facility])     MEDICATIONS GIVEN DURING THIS VISIT:  Medications  piperacillin-tazobactam (ZOSYN) IVPB 3.375 g (3.375 g Intravenous New Bag/Given 09/24/16 0549)  vancomycin (VANCOCIN) IVPB 750 mg/150 ml premix (not administered)  oxyCODONE (ROXICODONE INTENSOL) 20 MG/ML concentrated solution 5 mg (not administered)  scopolamine (TRANSDERM-SCOP) 1 MG/3DAYS 1.5 mg (1.5 mg Transdermal Patch Applied 09/24/16 0232)  divalproex (DEPAKOTE SPRINKLE) capsule 250 mg (250 mg Oral Not Given 09/24/16 0130)  enoxaparin (LOVENOX) injection 40 mg (not administered)  0.45 % sodium chloride infusion ( Intravenous New Bag/Given 09/24/16 0138)  ondansetron (ZOFRAN) tablet 4 mg (not administered)    Or    ondansetron (ZOFRAN) injection 4 mg (not administered)  haloperidol (HALDOL) tablet 2 mg (not administered)  sodium chloride 0.9 % bolus 1,000 mL (0 mLs Intravenous Stopped 09/23/16 2355)    And  sodium chloride 0.9 % bolus 250 mL (0 mLs Intravenous Stopped 09/24/16 0047)  vancomycin (VANCOCIN) IVPB 1000 mg/200 mL premix (0 mg Intravenous Stopped 09/23/16 2354)  potassium chloride 10 mEq in 100 mL IVPB (10 mEq Intravenous New Bag/Given 09/23/16 2356)     NEW OUTPATIENT MEDICATIONS STARTED DURING THIS VISIT:  None   Note:  This document was prepared using Dragon voice recognition software and may include unintentional dictation errors.  Documentation performed with assistance of the scribe. I have reviewed documentation and made changes as needed.   Nanda Quinton, MD Emergency Medicine   Margette Fast, MD 09/24/16 [phone]

## 2016-09-23 NOTE — Progress Notes (Signed)
Pharmacy Antibiotic Note  Debra Hoffman is a 75 y.o. female admitted on 09/23/2016 with sepsis.  Pharmacy has been consulted for Vancomycin and Zosyn dosing.  Plan: Vancomycin 1 GM IV loading dose, then 750 mg IV every 24 hours Zosyn 3.375 GM IV every 8 hours Labs per protocol  Height: 5\' 6"  (167.6 cm) Weight: 90 lb (40.8 kg) IBW/kg (Calculated) : 59.3  Temp (24hrs), Avg:100.4 F (38 C), Min:100.4 F (38 C), Max:100.4 F (38 C)  No results for input(s): WBC, CREATININE, LATICACIDVEN, VANCOTROUGH, VANCOPEAK, VANCORANDOM, GENTTROUGH, GENTPEAK, GENTRANDOM, TOBRATROUGH, TOBRAPEAK, TOBRARND, AMIKACINPEAK, AMIKACINTROU, AMIKACIN in the last 168 hours.  Estimated Creatinine Clearance: 40.3 mL/min (by C-G formula based on SCr of 0.38 mg/dL (L)).    Allergies  Allergen Reactions  . Ativan [Lorazepam]   . Shellfish Allergy     Reaction: unknown    Antimicrobials this admission: Vancomycin 9/30 >>  Zosyn 9/30 >>   Dose adjustments this admission:   Thank you for allowing pharmacy to be a part of this patient's care.  Chriss Czar 09/23/2016 10:28 PM

## 2016-09-23 NOTE — ED Notes (Signed)
MD at bedside. 

## 2016-09-24 ENCOUNTER — Encounter (HOSPITAL_COMMUNITY): Payer: Self-pay | Admitting: Family Medicine

## 2016-09-24 DIAGNOSIS — I998 Other disorder of circulatory system: Secondary | ICD-10-CM

## 2016-09-24 DIAGNOSIS — E43 Unspecified severe protein-calorie malnutrition: Secondary | ICD-10-CM

## 2016-09-24 DIAGNOSIS — F0390 Unspecified dementia without behavioral disturbance: Secondary | ICD-10-CM | POA: Diagnosis present

## 2016-09-24 DIAGNOSIS — F0280 Dementia in other diseases classified elsewhere without behavioral disturbance: Secondary | ICD-10-CM

## 2016-09-24 DIAGNOSIS — I639 Cerebral infarction, unspecified: Secondary | ICD-10-CM

## 2016-09-24 DIAGNOSIS — N390 Urinary tract infection, site not specified: Secondary | ICD-10-CM | POA: Diagnosis present

## 2016-09-24 LAB — CBC
HEMATOCRIT: 30.4 % — AB (ref 36.0–46.0)
HEMOGLOBIN: 9.2 g/dL — AB (ref 12.0–15.0)
MCH: 25.2 pg — ABNORMAL LOW (ref 26.0–34.0)
MCHC: 30.3 g/dL (ref 30.0–36.0)
MCV: 83.3 fL (ref 78.0–100.0)
Platelets: 290 10*3/uL (ref 150–400)
RBC: 3.65 MIL/uL — ABNORMAL LOW (ref 3.87–5.11)
RDW: 16.2 % — AB (ref 11.5–15.5)
WBC: 15.1 10*3/uL — AB (ref 4.0–10.5)

## 2016-09-24 LAB — BASIC METABOLIC PANEL
ANION GAP: 6 (ref 5–15)
BUN: 22 mg/dL — AB (ref 6–20)
CHLORIDE: 115 mmol/L — AB (ref 101–111)
CO2: 24 mmol/L (ref 22–32)
Calcium: 8.2 mg/dL — ABNORMAL LOW (ref 8.9–10.3)
Creatinine, Ser: <0.3 mg/dL — ABNORMAL LOW (ref 0.44–1.00)
GLUCOSE: 108 mg/dL — AB (ref 65–99)
POTASSIUM: 2.7 mmol/L — AB (ref 3.5–5.1)
Sodium: 145 mmol/L (ref 135–145)

## 2016-09-24 MED ORDER — HALOPERIDOL LACTATE 2 MG/ML PO CONC
2.0000 mg | ORAL | Status: DC | PRN
  Filled 2016-09-24: qty 1

## 2016-09-24 MED ORDER — SCOPOLAMINE 1 MG/3DAYS TD PT72
1.0000 | MEDICATED_PATCH | TRANSDERMAL | Status: DC
  Administered 2016-09-24 – 2016-09-27 (×2): 1.5 mg via TRANSDERMAL
  Filled 2016-09-24 (×2): qty 1

## 2016-09-24 MED ORDER — ONDANSETRON HCL 4 MG/2ML IJ SOLN: 4.0000 mg | Freq: Four times a day (QID) | INTRAMUSCULAR | Status: DC | PRN

## 2016-09-24 MED ORDER — ONDANSETRON HCL 4 MG PO TABS: 4.0000 mg | ORAL_TABLET | Freq: Four times a day (QID) | ORAL | Status: DC | PRN

## 2016-09-24 MED ORDER — ENOXAPARIN SODIUM 30 MG/0.3ML ~~LOC~~ SOLN
30.0000 mg | SUBCUTANEOUS | Status: DC
  Administered 2016-09-25 – 2016-09-27 (×3): 30 mg via SUBCUTANEOUS
  Filled 2016-09-24 (×3): qty 0.3

## 2016-09-24 MED ORDER — SODIUM CHLORIDE 0.45 % IV SOLN
INTRAVENOUS | Status: AC
Start: 2016-09-24 — End: 2016-09-24
  Administered 2016-09-24: 02:00:00 via INTRAVENOUS

## 2016-09-24 MED ORDER — OXYCODONE HCL 20 MG/ML PO CONC
5.0000 mg | ORAL | Status: DC | PRN
  Administered 2016-09-27: 5 mg via ORAL
  Filled 2016-09-24: qty 1

## 2016-09-24 MED ORDER — SCOPOLAMINE 1 MG/3DAYS TD PT72
MEDICATED_PATCH | TRANSDERMAL | Status: AC
  Filled 2016-09-24: qty 1

## 2016-09-24 MED ORDER — PIPERACILLIN-TAZOBACTAM 3.375 G IVPB
INTRAVENOUS | Status: AC
  Filled 2016-09-24: qty 50

## 2016-09-24 MED ORDER — HALOPERIDOL 2 MG PO TABS: 2.0000 mg | ORAL_TABLET | ORAL | Status: DC | PRN

## 2016-09-24 MED ORDER — DIVALPROEX SODIUM 125 MG PO CSDR
250.0000 mg | DELAYED_RELEASE_CAPSULE | Freq: Two times a day (BID) | ORAL | Status: DC
  Administered 2016-09-26 – 2016-09-27 (×3): 250 mg via ORAL
  Filled 2016-09-24 (×12): qty 2

## 2016-09-24 MED ORDER — ENOXAPARIN SODIUM 40 MG/0.4ML ~~LOC~~ SOLN: 40.0000 mg | SUBCUTANEOUS | Status: DC

## 2016-09-24 NOTE — H&P (Signed)
History and Physical    [name] [id number] DOB: August 16, 1941 DOA: 09/23/2016  PCP: Sabino Snipes KEY, MD  Patient coming from: home  Chief Complaint:  confusion  HPI: Debra Hoffman is a 75 y.o. female with medical history significant of advanced dementia, CAD, CVA, seizures, chronic ischemic limb, DM, HTN, chronic indwelling foley brought in by family for more confused than normal.  All history obtained from granddaughter.  Reports she was unresponsive earlier and would not awaken.  She has been eating on/off.  She aspirates on occasion.  Family had her at a hospice house about 3 weeks and they claim she was overdosed by the hospice house so they took her out and brought her home.  They are therefore not interested in hospice again.  Pt is on pain  meds and sometimes get more sedated after taking meds, but today it was worse than normal.  No fevers.  No n/v/d.  No cough.  At her baseline, she can sit up in a wheelchair and speak but half the time does not make sense when she speaks and half the time does not recognize her family members.  Pt found to have uti and possible pna.  Referred for admission for sepsis.  Review of Systems: As per HPI otherwise 10 point review of systems negative per family  Past Medical History:  Diagnosis Date  . Cervicalgia   . Constipation   . Coronary artery disease   . Dementia without behavioral disturbance   . Diabetes mellitus without complication ([place])   . Dysuria   . Glaucoma   . Hypertension   . Insomnia   . Memory loss   . Pressure ulcer of foot, stage 2   . Seizures ([place])   . Stroke ([place])   . Urinary incontinence   . Wheelchair dependence     Past Surgical History:  Procedure Laterality Date  . AMPUTATION Right 07/23/2016   Procedure: AMPUTATION ABOVE KNEE;  Surgeon: [name], MD;  Location: [facility] ORS;  Service: Vascular;  Laterality: Right;  . CARDIAC SURGERY     coronary artery bypass graft  . CARDIAC SURGERY    .  CORONARY ARTERY BYPASS GRAFT       reports that she has never smoked. She has never used smokeless tobacco. She reports that she does not drink alcohol or use drugs.  Allergies  Allergen Reactions  . Ativan [Lorazepam]   . Shellfish Allergy     Reaction: unknown    Family History  Problem Relation Age of Onset  . Heart disease Mother   . Heart disease Father     Prior to Admission medications   Medication Sig Start Date End Date Taking? Authorizing Provider  divalproex (DEPAKOTE SPRINKLE) 125 MG capsule Take 250 mg by mouth 2 (two) times daily.    Yes Historical Provider, MD  ferrous sulfate 220 (44 Fe) MG/5ML solution Take 220 mg by mouth 2 (two) times daily with a meal.   Yes Historical Provider, MD  haloperidol (HALDOL) 2 MG/ML solution Take 2 mg by mouth every 4 (four) hours as needed for agitation.   Yes Historical Provider, MD  oxyCODONE (ROXICODONE INTENSOL) 20 MG/ML concentrated solution Take 5 mg by mouth every 2 (two) hours as needed for severe pain.   Yes Historical Provider, MD  potassium chloride (K-DUR,KLOR-CON) 10 MEQ tablet Take 10 mEq by mouth 2 (two) times daily.   Yes Historical Provider, MD  scopolamine (TRANSDERM-SCOP) 1 MG/3DAYS Place 1 patch onto the skin  every 3 (three) days.   Yes Historical Provider, MD    Physical Exam: Vitals:   09/23/16 2302 09/23/16 2350 09/24/16 0000 09/24/16 0030  BP: 132/94 172/98 178/99 146/84  Pulse: 114 103 97 86  Resp: 20 23 18 19   Temp:      TempSrc:      SpO2: 99% 99% 100% 99%  Weight:      Height:          Constitutional: NAD, calm, comfortable, cachectic Vitals:   09/23/16 2302 09/23/16 2350 09/24/16 0000 09/24/16 0030  BP: 132/94 172/98 178/99 146/84  Pulse: 114 103 97 86  Resp: 20 23 18 19   Temp:      TempSrc:      SpO2: 99% 99% 100% 99%  Weight:      Height:       Eyes: PERRL, lids and conjunctivae normal ENMT: Mucous membranes are moist. Posterior pharynx clear of any exudate or lesions.Normal  dentition.  Neck: normal, supple, no masses, no thyromegaly Respiratory: clear to auscultation bilaterally, no wheezing, no crackles. Normal respiratory effort. No accessory muscle use.  Cardiovascular: Regular rate and rhythm, no murmurs / rubs / gallops. No extremity edema. No carotid bruits.  Abdomen: no tenderness, no masses palpated. No hepatosplenomegaly. Bowel sounds positive.  Musculoskeletal: no clubbing / cyanosis. No joint deformity upper and lower extremities. Good ROM, no contractures. Normal muscle tone.  Skin: no rashes, lesions, ulcers. No induration chronic ischemic to le decreased pules several blackish colored toes Neurologic: CN 2-12 grossly intact. Psychiatric:  Alert, no agitated   Labs on Admission: I have personally reviewed following labs and imaging studies  CBC:  Recent Labs Lab 09/23/16 2220  WBC 12.6*  NEUTROABS 11.1*  HGB 10.0*  HCT 32.7*  MCV 83.2  PLT [id number]   Basic Metabolic Panel:  Recent Labs Lab 09/23/16 2220  NA 145  K 2.9*  CL 115*  CO2 25  GLUCOSE 120*  BUN 25*  CREATININE 0.33*  CALCIUM 8.7*   GFR: Estimated Creatinine Clearance: 40.3 mL/min (by C-G formula based on SCr of 0.33 mg/dL (L)). Liver Function Tests:  Recent Labs Lab 09/23/16 2220  AST 13*  ALT 6*  ALKPHOS 69  BILITOT 1.1  PROT 6.6  ALBUMIN 1.9*    Urine analysis:    Component Value Date/Time   COLORURINE AMBER (A) 09/23/2016 2205   APPEARANCEUR CLOUDY (A) 09/23/2016 2205   APPEARANCEUR Hazy 03/21/2015 1130   LABSPEC 1.025 09/23/2016 2205   LABSPEC 1.012 03/21/2015 1130   PHURINE 6.0 09/23/2016 2205   GLUCOSEU 100 (A) 09/23/2016 2205   GLUCOSEU Negative 03/21/2015 1130   HGBUR LARGE (A) 09/23/2016 2205   BILIRUBINUR SMALL (A) 09/23/2016 2205   BILIRUBINUR Negative 03/21/2015 1130   KETONESUR NEGATIVE 09/23/2016 2205   PROTEINUR 100 (A) 09/23/2016 2205   NITRITE NEGATIVE 09/23/2016 2205   LEUKOCYTESUR MODERATE (A) 09/23/2016 2205   LEUKOCYTESUR 1+  03/21/2015 1130   Radiological Exams on Admission: Ct Head Wo Contrast  Result Date: 09/23/2016 CLINICAL DATA:  Altered mental status. Fever. Unresponsive since 2000 hours. EXAM: CT HEAD WITHOUT CONTRAST TECHNIQUE: Contiguous axial images were obtained from the base of the skull through the vertex without intravenous contrast. COMPARISON:  MRI brain 10/26/2015.  CT head 10/25/2015. FINDINGS: Brain: Diffuse bilateral cerebral atrophy. Prominent CSF spaces along the frontoparietal convexity bilaterally, likely representing subdural hygromas. No change since prior study. Mild ventricular dilatation consistent with central atrophy. Low-attenuation changes throughout the deep white matter consistent with small  vessel ischemia. No mass effect or midline shift. No abnormal extra-axial fluid collections. Gray-white matter junctions are distinct. Basal cisterns are not effaced. No evidence of acute intracranial hemorrhage. Vascular: Atherosclerotic vascular calcifications are present. Skull: Normal. Negative for fracture or focal lesion. Sinuses/Orbits: Retention cysts in the sphenoid sinuses and left maxillary antrum. No acute air-fluid levels in the paranasal sinuses. Mastoid air cells are not opacified. Other: No acute changes since previous study. IMPRESSION: No acute intracranial abnormalities. Chronic atrophy and small vessel ischemic changes. Chronic bilateral subdural hygromas. Electronically Signed   By: Lucienne Capers M.D.   On: 09/23/2016 23:29   Dg Chest Port 1 View  Result Date: 09/23/2016 CLINICAL DATA:  Acute onset of fever and unresponsiveness. Code sepsis. Initial encounter. EXAM: PORTABLE CHEST 1 VIEW COMPARISON:  Chest radiograph from 09/04/2016 FINDINGS: The lungs are well-aerated. Minimal left basilar opacity may reflect atelectasis or mild infection, depending on the patient's symptoms. There is no evidence of pleural effusion or pneumothorax. The cardiomediastinal silhouette is normal in  size. The patient is status post median sternotomy. No acute osseous abnormalities are seen. IMPRESSION: Minimal left basilar opacity may reflect atelectasis or possibly mild infection, depending on the patient's symptoms. Electronically Signed   By: Garald Balding M.D.   On: 09/23/2016 22:48    Assessment/Plan 75 yo female with advanced dementia comes in with encephalopathy and sepsis  Principal Problem:   Sepsis ([place])- could be urine, or lung.  Place on iv zosyn and vancomycin.  Family agreeable to abx and ivf only, no pressors.  Vitals stable otherwise.  Active Problems:   Pneumonia- abx as above   Protein-calorie malnutrition, severe- noted   DNR (do not resuscitate)   Lower limb ischemia- chronic, no aggressive intervention per family wishes   UTI (lower urinary tract infection)- abx as above   Stroke [facility])- noted   Dementia without behavioral disturbance- noted   Family not interested in home hospice due to their recent experience with local hospice house.  They already have [facility] arranged for her at home.    DVT prophylaxis:   lovenox Code Status:   DNR/I, no pressors Family Communication:  Granddaughter Disposition Plan:  Per day plan Consults called:  none Admission status:  admit   DAVID,RACHAL A MD [facility]  If 7PM-7AM, please contact night-coverage [email or web address] Password [email or web address]  09/24/2016, 12:44 AM

## 2016-09-24 NOTE — Progress Notes (Signed)
PROGRESS NOTE    Debra Hoffman  [id number] DOB: [birth year] DOA: 09/23/2016 PCP: Sabino Snipes KEY, MD    Brief Narrative: 75 yo with advanced dementia, CVA, CAD, Seizure, DM, chronic ischemic limb, HTN, indwelling foley catheter, under hospice care, admitted for more confusion, sepsis due to UTI, and possible PNA.  She is unresponsive. Family doesn't want to have her returned to her hospice facility.  She is getting IV Van/Zosyn, but family doesn't want pressor and she is a DNR/DNI.  She is not responsive to me.   Assessment & Plan:   Principal Problem:   Sepsis ([place]) Active Problems:   Pneumonia   Protein-calorie malnutrition, severe   DNR (do not resuscitate)   Lower limb ischemia   UTI (lower urinary tract infection)   Stroke ([place])   Dementia without behavioral disturbance   1. Sepsis due to UTI in patient with advanced dementia and is under hospice care:  Will continue with antibiotics.  Continue with current Tx.  Maintain DNR/DNI Code status.  Will consult palliative care and hospice care tomorrow.   DVT prophylaxis: Lovenox Code Status: DNR/DNI/ No pressors Family Communication: None.  Disposition Plan: TBD.   Consultants:   Awatiing palliative care consultation.   Procedures:   None.   Antimicrobials: Anti-infectives    Start     Dose/Rate Route Frequency Ordered Stop   09/24/16 2300  vancomycin (VANCOCIN) IVPB 750 mg/150 ml premix     750 mg 150 mL/hr over 60 Minutes Intravenous Every 24 hours 09/23/16 2227     09/23/16 2230  piperacillin-tazobactam (ZOSYN) IVPB 3.375 g     3.375 g 12.5 mL/hr over 240 Minutes Intravenous Every 8 hours 09/23/16 2226     09/23/16 2215  piperacillin-tazobactam (ZOSYN) IVPB 3.375 g  Status:  Discontinued     3.375 g 100 mL/hr over 30 Minutes Intravenous  Once 09/23/16 2209 09/23/16 2226   09/23/16 2215  vancomycin (VANCOCIN) IVPB 1000 mg/200 mL premix     1,000 mg 200 mL/hr over 60 Minutes Intravenous  Once 09/23/16  2209 09/23/16 2354       Subjective:   Doesn't respond to environment.    Objective: Vitals:   09/23/16 2350 09/24/16 0000 09/24/16 0030 09/24/16 0500  BP: 172/98 178/99 146/84 139/82  Pulse: 103 97 86 94  Resp: 23 18 19 16   Temp:    98.6 F (37 C)  TempSrc:    Axillary  SpO2: 99% 100% 99% 100%  Weight:      Height:        Intake/Output Summary (Last 24 hours) at 09/24/16 1240 Last data filed at 09/24/16 0549  Gross per 24 hour  Intake           1962.5 ml  Output                0 ml  Net           1962.5 ml   Filed Weights   09/23/16 2200  Weight: 40.8 kg (90 lb)    Examination:  General exam: Appears calm and comfortable  Respiratory system: Clear to auscultation. Respiratory effort normal. Cardiovascular system: S1 & S2 heard, RRR. No JVD, murmurs, rubs, gallops or clicks. No pedal edema. Gastrointestinal system: Abdomen is nondistended, soft and nontender. No organomegaly or masses felt. Normal bowel sounds heard. Central nervous system: lethargic.  Not responding.  No focal neurological deficits. Extremities: Symmetric 5 x 5 power. Skin: No rashes  Data Reviewed: I have personally reviewed following  labs and imaging studies  CBC:  Recent Labs Lab 09/23/16 2220 09/24/16 0604  WBC 12.6* 15.1*  NEUTROABS 11.1*  --   HGB 10.0* 9.2*  HCT 32.7* 30.4*  MCV 83.2 83.3  PLT 328 [id number]   Basic Metabolic Panel:  Recent Labs Lab 09/23/16 2220 09/24/16 0604  NA 145 145  K 2.9* 2.7*  CL 115* 115*  CO2 25 24  GLUCOSE 120* 108*  BUN 25* 22*  CREATININE 0.33* <0.30*  CALCIUM 8.7* 8.2*   GFR: CrCl cannot be calculated (This lab value cannot be used to calculate CrCl because it is not a number: <0.30). Liver Function Tests:  Recent Labs Lab 09/23/16 2220  AST 13*  ALT 6*  ALKPHOS 69  BILITOT 1.1  PROT 6.6  ALBUMIN 1.9*    Recent Labs Lab 09/23/16 2233  LATICACIDVEN 1.62    Recent Results (from the past 240 hour(s))  Blood Culture (routine x  2)     Status: None (Preliminary result)   Collection Time: 09/23/16 10:20 PM  Result Value Ref Range Status   Specimen Description BLOOD RIGHT FOREARM  Final   Special Requests BOTTLES DRAWN AEROBIC AND ANAEROBIC 6CC EACH  Final   Culture NO GROWTH < 12 HOURS  Final   Report Status PENDING  Incomplete  Blood Culture (routine x 2)     Status: None (Preliminary result)   Collection Time: 09/24/16  6:16 AM  Result Value Ref Range Status   Specimen Description BLOOD BLOOD LEFT WRIST  Final   Special Requests BOTTLES DRAWN AEROBIC ONLY 4CC  Final   Culture PENDING  Incomplete   Report Status PENDING  Incomplete     Radiology Studies: Ct Head Wo Contrast  Result Date: 09/23/2016 CLINICAL DATA:  Altered mental status. Fever. Unresponsive since 2000 hours. EXAM: CT HEAD WITHOUT CONTRAST TECHNIQUE: Contiguous axial images were obtained from the base of the skull through the vertex without intravenous contrast. COMPARISON:  MRI brain 10/26/2015.  CT head 10/25/2015. FINDINGS: Brain: Diffuse bilateral cerebral atrophy. Prominent CSF spaces along the frontoparietal convexity bilaterally, likely representing subdural hygromas. No change since prior study. Mild ventricular dilatation consistent with central atrophy. Low-attenuation changes throughout the deep white matter consistent with small vessel ischemia. No mass effect or midline shift. No abnormal extra-axial fluid collections. Gray-white matter junctions are distinct. Basal cisterns are not effaced. No evidence of acute intracranial hemorrhage. Vascular: Atherosclerotic vascular calcifications are present. Skull: Normal. Negative for fracture or focal lesion. Sinuses/Orbits: Retention cysts in the sphenoid sinuses and left maxillary antrum. No acute air-fluid levels in the paranasal sinuses. Mastoid air cells are not opacified. Other: No acute changes since previous study. IMPRESSION: No acute intracranial abnormalities. Chronic atrophy and small  vessel ischemic changes. Chronic bilateral subdural hygromas. Electronically Signed   By: Lucienne Capers M.D.   On: 09/23/2016 23:29   Dg Chest Port 1 View  Result Date: 09/23/2016 CLINICAL DATA:  Acute onset of fever and unresponsiveness. Code sepsis. Initial encounter. EXAM: PORTABLE CHEST 1 VIEW COMPARISON:  Chest radiograph from 09/04/2016 FINDINGS: The lungs are well-aerated. Minimal left basilar opacity may reflect atelectasis or mild infection, depending on the patient's symptoms. There is no evidence of pleural effusion or pneumothorax. The cardiomediastinal silhouette is normal in size. The patient is status post median sternotomy. No acute osseous abnormalities are seen. IMPRESSION: Minimal left basilar opacity may reflect atelectasis or possibly mild infection, depending on the patient's symptoms. Electronically Signed   By: Francoise Schaumann.D.  On: 09/23/2016 22:48    Scheduled Meds: . divalproex  250 mg Oral BID  . enoxaparin (LOVENOX) injection  40 mg Subcutaneous Q24H  . piperacillin-tazobactam (ZOSYN)  IV  3.375 g Intravenous Q8H  . scopolamine  1 patch Transdermal Q72H  . vancomycin  750 mg Intravenous Q24H   Continuous Infusions: . sodium chloride 75 mL/hr at 09/24/16 0138     LOS: 1 day   Mario Voong, MD FACP Hospitalist.   If 7PM-7AM, please contact night-coverage [email or web address] Password [email or web address] 09/24/2016, 12:40 PM

## 2016-09-25 DIAGNOSIS — F0150 Vascular dementia without behavioral disturbance: Secondary | ICD-10-CM

## 2016-09-25 DIAGNOSIS — Z515 Encounter for palliative care: Secondary | ICD-10-CM

## 2016-09-25 DIAGNOSIS — Z7189 Other specified counseling: Secondary | ICD-10-CM

## 2016-09-25 DIAGNOSIS — F0390 Unspecified dementia without behavioral disturbance: Secondary | ICD-10-CM

## 2016-09-25 LAB — BLOOD CULTURE ID PANEL (REFLEXED)
Acinetobacter baumannii: NOT DETECTED
CANDIDA ALBICANS: NOT DETECTED
CANDIDA GLABRATA: NOT DETECTED
CANDIDA KRUSEI: NOT DETECTED
CANDIDA TROPICALIS: NOT DETECTED
Candida parapsilosis: NOT DETECTED
Carbapenem resistance: NOT DETECTED
ENTEROBACTER CLOACAE COMPLEX: NOT DETECTED
ESCHERICHIA COLI: NOT DETECTED
Enterobacteriaceae species: NOT DETECTED
Enterococcus species: NOT DETECTED
Haemophilus influenzae: NOT DETECTED
KLEBSIELLA OXYTOCA: NOT DETECTED
Klebsiella pneumoniae: NOT DETECTED
LISTERIA MONOCYTOGENES: NOT DETECTED
METHICILLIN RESISTANCE: DETECTED — AB
Neisseria meningitidis: NOT DETECTED
PROTEUS SPECIES: NOT DETECTED
Pseudomonas aeruginosa: NOT DETECTED
SERRATIA MARCESCENS: NOT DETECTED
STREPTOCOCCUS PNEUMONIAE: NOT DETECTED
STREPTOCOCCUS SPECIES: NOT DETECTED
Staphylococcus aureus (BCID): NOT DETECTED
Staphylococcus species: DETECTED — AB
Streptococcus agalactiae: NOT DETECTED
Streptococcus pyogenes: NOT DETECTED
Vancomycin resistance: NOT DETECTED

## 2016-09-25 LAB — BASIC METABOLIC PANEL
Anion gap: 3 — ABNORMAL LOW (ref 5–15)
BUN: 20 mg/dL (ref 6–20)
CO2: 25 mmol/L (ref 22–32)
Calcium: 8.3 mg/dL — ABNORMAL LOW (ref 8.9–10.3)
Chloride: 114 mmol/L — ABNORMAL HIGH (ref 101–111)
GLUCOSE: 69 mg/dL (ref 65–99)
POTASSIUM: 2.9 mmol/L — AB (ref 3.5–5.1)
SODIUM: 142 mmol/L (ref 135–145)

## 2016-09-25 LAB — CBC
HEMATOCRIT: 26.9 % — AB (ref 36.0–46.0)
Hemoglobin: 8.3 g/dL — ABNORMAL LOW (ref 12.0–15.0)
MCH: 25.5 pg — ABNORMAL LOW (ref 26.0–34.0)
MCHC: 30.9 g/dL (ref 30.0–36.0)
MCV: 82.8 fL (ref 78.0–100.0)
Platelets: 296 10*3/uL (ref 150–400)
RBC: 3.25 MIL/uL — ABNORMAL LOW (ref 3.87–5.11)
RDW: 16.2 % — ABNORMAL HIGH (ref 11.5–15.5)
WBC: 15.1 10*3/uL — ABNORMAL HIGH (ref 4.0–10.5)

## 2016-09-25 LAB — GLUCOSE, CAPILLARY: GLUCOSE-CAPILLARY: 108 mg/dL — AB (ref 65–99)

## 2016-09-25 MED ORDER — MAGNESIUM SULFATE 2 GM/50ML IV SOLN
2.0000 g | Freq: Once | INTRAVENOUS | Status: AC
  Administered 2016-09-25: 2 g via INTRAVENOUS
  Filled 2016-09-25: qty 50

## 2016-09-25 MED ORDER — POTASSIUM CHLORIDE 10 MEQ/100ML IV SOLN
10.0000 meq | INTRAVENOUS | Status: AC
  Administered 2016-09-25 (×3): 10 meq via INTRAVENOUS

## 2016-09-25 MED ORDER — POTASSIUM CHLORIDE 10 MEQ/100ML IV SOLN
INTRAVENOUS | Status: AC
  Administered 2016-09-25: 10 meq via INTRAVENOUS
  Filled 2016-09-25: qty 300

## 2016-09-25 NOTE — Progress Notes (Signed)
PROGRESS NOTE    Debra Hoffman  [id number] DOB: 05-09-1941 DOA: 09/23/2016 PCP: Sabino Snipes KEY, MD   Brief Narrative: 75 yo with advanced dementia, CVA, CAD, Seizure, DM, chronic ischemic limb, HTN, indwelling foley catheter, under hospice care, admitted for more confusion, sepsis due to UTI, and possible PNA.  She is unresponsive. Family doesn't want to have her returned to her hospice facility.  She is getting IV Van/Zosyn, but family doesn't want pressor and she is a DNR/DNI.  She is a little more responsive.  Trying to say good morning in a slurred speech.    Sepsis due to UTI in patient with advanced dementia and is under hospice care:  Will continue with antibiotics.  Continue with current Tx.  Maintain DNR/DNI Code status.  Appreciate palliative care consultation.  Family would like  Take her home to [facility] with hospice help.  Doesn't want to [facility] to [facility] or [facility].    DVT prophylaxis: Lovenox Code Status: DNR/DNI/ No pressors Family Communication: None.  Disposition Plan: TBD.   Antimicrobials: Anti-infectives    Start     Dose/Rate Route Frequency Ordered Stop   09/24/16 2300  vancomycin (VANCOCIN) IVPB 750 mg/150 ml premix     750 mg 150 mL/hr over 60 Minutes Intravenous Every 24 hours 09/23/16 2227     09/23/16 2230  piperacillin-tazobactam (ZOSYN) IVPB 3.375 g     3.375 g 12.5 mL/hr over 240 Minutes Intravenous Every 8 hours 09/23/16 2226     09/23/16 2215  piperacillin-tazobactam (ZOSYN) IVPB 3.375 g  Status:  Discontinued     3.375 g 100 mL/hr over 30 Minutes Intravenous  Once 09/23/16 2209 09/23/16 2226   09/23/16 2215  vancomycin (VANCOCIN) IVPB 1000 mg/200 mL premix     1,000 mg 200 mL/hr over 60 Minutes Intravenous  Once 09/23/16 2209 09/23/16 2354       Subjective: minimally responsive.    Objective: Vitals:   09/24/16 0500 09/24/16 1404 09/24/16 2142 09/25/16 0500  BP: 139/82 (!) 144/71 113/69 125/72  Pulse: 94 74  79 85  Resp: 16 16 16 16   Temp: 98.6 F (37 C) 97.7 F (36.5 C) 97.8 F (36.6 C) 98.5 F (36.9 C)  TempSrc: Axillary Axillary Axillary Axillary  SpO2: 100% 100% 100% 100%  Weight:      Height:        Intake/Output Summary (Last 24 hours) at 09/25/16 1243 Last data filed at 09/25/16 0533  Gross per 24 hour  Intake              600 ml  Output              100 ml  Net              500 ml   Filed Weights   09/23/16 2200  Weight: 40.8 kg (90 lb)    Examination:  General exam: Appears calm and comfortable  Respiratory system: Clear to auscultation. Respiratory effort normal. Cardiovascular system: S1 & S2 heard, RRR. No JVD, murmurs, rubs, gallops or clicks. No pedal edema. Gastrointestinal system: Abdomen is nondistended, soft and nontender. No organomegaly or masses felt. Normal bowel sounds heard. Central nervous system: Very lethargic.  No focal neurological deficits. Extremities: Symmetric 5 x 5 power. Skin: No rashes, lesions or ulcers Psychiatry: Judgement and insight appear normal. Mood & affect appropriate.   Data Reviewed: I have personally reviewed following labs and imaging studies  CBC:  Recent Labs Lab 09/23/16 2220 09/24/16 0604  09/25/16 0635  WBC 12.6* 15.1* 15.1*  NEUTROABS 11.1*  --   --   HGB 10.0* 9.2* 8.3*  HCT 32.7* 30.4* 26.9*  MCV 83.2 83.3 82.8  PLT 328 290 0000000   Basic Metabolic Panel:  Recent Labs Lab 09/23/16 2220 09/24/16 0604 09/25/16 0635  NA 145 145 142  K 2.9* 2.7* 2.9*  CL 115* 115* 114*  CO2 25 24 25   GLUCOSE 120* 108* 69  BUN 25* 22* 20  CREATININE 0.33* <0.30* <0.30*  CALCIUM 8.7* 8.2* 8.3*   GFR: Liver Function Tests:  Recent Labs Lab 09/23/16 2220  AST 13*  ALT 6*  ALKPHOS 69  BILITOT 1.1  PROT 6.6  ALBUMIN 1.9*   CBG:  Recent Labs Lab 09/23/16 2157  GLUCAP 108*   Sepsis Labs:  Recent Labs Lab 09/23/16 2233  LATICACIDVEN 1.62    Recent Results (from the past 240 hour(s))  Blood Culture  (routine x 2)     Status: Abnormal (Preliminary result)   Collection Time: 09/23/16 10:20 PM  Result Value Ref Range Status   Specimen Description BLOOD RIGHT FOREARM  Final   Special Requests BOTTLES DRAWN AEROBIC AND ANAEROBIC [facility] EACH  Final   Culture  Setup Time   Final    GRAM POSITIVE COCCI Gram Stain Report Called to,Read Back By and Verified With: [place], M AT 1756 ON 09/24/2016 BY EVA OBTAINED FROM ANAEROEBIC BOTTLE Organism ID to follow Performed at [place] (COAGULASE NEGATIVE) (A)  Final   Report Status PENDING  Incomplete  Blood Culture ID Panel (Reflexed)     Status: Abnormal   Collection Time: 09/23/16 10:20 PM  Result Value Ref Range Status   Enterococcus species NOT DETECTED NOT DETECTED Final   Vancomycin resistance NOT DETECTED NOT DETECTED Final   Listeria monocytogenes NOT DETECTED NOT DETECTED Final   Staphylococcus species DETECTED (A) NOT DETECTED Final    Comment: CRITICAL RESULT CALLED TO, READ BACK BY AND VERIFIED WITH: TO  BSTOCUM(RN) BY TCLEVELAND 09/25/16 AT 1:26AM     Staphylococcus aureus NOT DETECTED NOT DETECTED Final   Methicillin resistance DETECTED (A) NOT DETECTED Final    Comment: CRITICAL RESULT CALLED TO, READ BACK BY AND VERIFIED WITH: TO  BSTOCUM(RN) BY TCLEVELAND 09/25/16 AT 1:26AM    Streptococcus species NOT DETECTED NOT DETECTED Final   Streptococcus agalactiae NOT DETECTED NOT DETECTED Final   Streptococcus pneumoniae NOT DETECTED NOT DETECTED Final   Streptococcus pyogenes NOT DETECTED NOT DETECTED Final   Acinetobacter baumannii NOT DETECTED NOT DETECTED Final   Enterobacteriaceae species NOT DETECTED NOT DETECTED Final   Enterobacter cloacae complex NOT DETECTED NOT DETECTED Final   Escherichia coli NOT DETECTED NOT DETECTED Final   Klebsiella oxytoca NOT DETECTED NOT DETECTED Final   Klebsiella pneumoniae NOT DETECTED NOT DETECTED Final   Proteus species NOT DETECTED NOT DETECTED  Final   Serratia marcescens NOT DETECTED NOT DETECTED Final   Carbapenem resistance NOT DETECTED NOT DETECTED Final   Haemophilus influenzae NOT DETECTED NOT DETECTED Final   Neisseria meningitidis NOT DETECTED NOT DETECTED Final   Pseudomonas aeruginosa NOT DETECTED NOT DETECTED Final   Candida albicans NOT DETECTED NOT DETECTED Final   Candida glabrata NOT DETECTED NOT DETECTED Final   Candida krusei NOT DETECTED NOT DETECTED Final   Candida parapsilosis NOT DETECTED NOT DETECTED Final   Candida tropicalis NOT DETECTED NOT DETECTED Final    Comment: Performed at [facility]  Blood Culture (routine  x 2)     Status: None (Preliminary result)   Collection Time: 09/24/16  6:16 AM  Result Value Ref Range Status   Specimen Description BLOOD BLOOD LEFT WRIST  Final   Special Requests BOTTLES DRAWN AEROBIC ONLY 4CC  Final   Culture NO GROWTH 1 DAY  Final   Report Status PENDING  Incomplete     Radiology Studies: Ct Head Wo Contrast  Result Date: 09/23/2016 CLINICAL DATA:  Altered mental status. Fever. Unresponsive since 2000 hours. EXAM: CT HEAD WITHOUT CONTRAST TECHNIQUE: Contiguous axial images were obtained from the base of the skull through the vertex without intravenous contrast. COMPARISON:  MRI brain 10/26/2015.  CT head 10/25/2015. FINDINGS: Brain: Diffuse bilateral cerebral atrophy. Prominent CSF spaces along the frontoparietal convexity bilaterally, likely representing subdural hygromas. No change since prior study. Mild ventricular dilatation consistent with central atrophy. Low-attenuation changes throughout the deep white matter consistent with small vessel ischemia. No mass effect or midline shift. No abnormal extra-axial fluid collections. Gray-white matter junctions are distinct. Basal cisterns are not effaced. No evidence of acute intracranial hemorrhage. Vascular: Atherosclerotic vascular calcifications are present. Skull: Normal. Negative for fracture or focal lesion.  Sinuses/Orbits: Retention cysts in the sphenoid sinuses and left maxillary antrum. No acute air-fluid levels in the paranasal sinuses. Mastoid air cells are not opacified. Other: No acute changes since previous study. IMPRESSION: No acute intracranial abnormalities. Chronic atrophy and small vessel ischemic changes. Chronic bilateral subdural hygromas. Electronically Signed   By: Lucienne Capers M.D.   On: 09/23/2016 23:29   Dg Chest Port 1 View  Result Date: 09/23/2016 CLINICAL DATA:  Acute onset of fever and unresponsiveness. Code sepsis. Initial encounter. EXAM: PORTABLE CHEST 1 VIEW COMPARISON:  Chest radiograph from 09/04/2016 FINDINGS: The lungs are well-aerated. Minimal left basilar opacity may reflect atelectasis or mild infection, depending on the patient's symptoms. There is no evidence of pleural effusion or pneumothorax. The cardiomediastinal silhouette is normal in size. The patient is status post median sternotomy. No acute osseous abnormalities are seen. IMPRESSION: Minimal left basilar opacity may reflect atelectasis or possibly mild infection, depending on the patient's symptoms. Electronically Signed   By: Garald Balding M.D.   On: 09/23/2016 22:48    Scheduled Meds: . divalproex  250 mg Oral BID  . enoxaparin (LOVENOX) injection  30 mg Subcutaneous Q24H  . piperacillin-tazobactam (ZOSYN)  IV  3.375 g Intravenous Q8H  . scopolamine  1 patch Transdermal Q72H  . vancomycin  750 mg Intravenous Q24H   Continuous Infusions:    LOS: 2 days   Lamin Chandley, MD FACP Hospitalist.   If 7PM-7AM, please contact night-coverage [email or web address] Password [email or web address] 09/25/2016, 12:43 PM
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
## 2016-09-25 NOTE — Progress Notes (Signed)
PHARMACY - PHYSICIAN COMMUNICATION CRITICAL VALUE ALERT - BLOOD CULTURE IDENTIFICATION (BCID)  Results for orders placed or performed during the hospital encounter of 09/23/16  Blood Culture ID Panel (Reflexed) (Collected: 09/23/2016 10:20 PM)  Result Value Ref Range   Enterococcus species NOT DETECTED NOT DETECTED   Vancomycin resistance NOT DETECTED NOT DETECTED   Listeria monocytogenes NOT DETECTED NOT DETECTED   Staphylococcus species DETECTED (A) NOT DETECTED   Staphylococcus aureus NOT DETECTED NOT DETECTED   Methicillin resistance DETECTED (A) NOT DETECTED   Streptococcus species NOT DETECTED NOT DETECTED   Streptococcus agalactiae NOT DETECTED NOT DETECTED   Streptococcus pneumoniae NOT DETECTED NOT DETECTED   Streptococcus pyogenes NOT DETECTED NOT DETECTED   Acinetobacter baumannii NOT DETECTED NOT DETECTED   Enterobacteriaceae species NOT DETECTED NOT DETECTED   Enterobacter cloacae complex NOT DETECTED NOT DETECTED   Escherichia coli NOT DETECTED NOT DETECTED   Klebsiella oxytoca NOT DETECTED NOT DETECTED   Klebsiella pneumoniae NOT DETECTED NOT DETECTED   Proteus species NOT DETECTED NOT DETECTED   Serratia marcescens NOT DETECTED NOT DETECTED   Carbapenem resistance NOT DETECTED NOT DETECTED   Haemophilus influenzae NOT DETECTED NOT DETECTED   Neisseria meningitidis NOT DETECTED NOT DETECTED   Pseudomonas aeruginosa NOT DETECTED NOT DETECTED   Candida albicans NOT DETECTED NOT DETECTED   Candida glabrata NOT DETECTED NOT DETECTED   Candida krusei NOT DETECTED NOT DETECTED   Candida parapsilosis NOT DETECTED NOT DETECTED   Candida tropicalis NOT DETECTED NOT DETECTED    No Changes to prescribed antibiotics required: Patient currently on Vancomycin and Zosyn. Vancomycin appropriate for MRSA.  F/U plan  Isac Sarna, BS Vena Austria, BCPS Clinical Pharmacist Pager [phone] 09/25/2016  3:10 PM

## 2016-09-25 NOTE — Progress Notes (Signed)
POC discussed with the patients POA.  She verbalized understanding.  She states to me that she does not want anyone else to have information on the patients care.  I verbalized understanding and passed this on to the oncoming nurse [place].

## 2016-09-26 DIAGNOSIS — Z66 Do not resuscitate: Secondary | ICD-10-CM

## 2016-09-26 LAB — CULTURE, BLOOD (ROUTINE X 2)

## 2016-09-26 LAB — URINE CULTURE

## 2016-09-26 MED ORDER — AMOXICILLIN-POT CLAVULANATE 875-125 MG PO TABS
1.0000 | ORAL_TABLET | Freq: Two times a day (BID) | ORAL | Status: DC
  Administered 2016-09-26 – 2016-09-27 (×3): 1 via ORAL
  Filled 2016-09-26 (×3): qty 1

## 2016-09-26 MED ORDER — AMOXICILLIN-POT CLAVULANATE 875-125 MG PO TABS: 1.0000 | ORAL_TABLET | Freq: Two times a day (BID) | ORAL | Status: DC

## 2016-09-26 NOTE — Care Management Note (Addendum)
Case Management Note  Patient Details  Name: Trenna Cremeans MRN: [id number] Date of Birth: 10-08-1941  Subjective/Objective:  Patient adm from home with UTI sepsis. She has a POA, her granddaughter [place]. I Spoke with [name], who states that when her grandmother is discharged she will be taking her back to [place] with her. Per [name], patient has been in [place] with Hilda Blades ( patient's daughter ) and another family member. She states that she did not make the decision to bring pt. to hospital, that she wants patient to be comfortable at home. I asked if she would like for me to refer to other hospices, as pt. Has been discharged from [facility] last month due to social issues with the family and safety issues (see previous notes of admission). [name] states she does not want any referrals made and she does not trust any hospice and that hospice is not what she thought it would be. [name] is irate on the phone concerning family issues. She has asked that no discussion be had with any other family members. She asks that if she is discharged to call her and she would come pick her up. She states that she will be fine with Ms. Olena Heckle PCS aide and that she has all the equipment she needs (hospital bed, WC, etc.).    Action/Plan: Anticipate DC home in care of POA Lateisha. Will follow.  Expected Discharge Date:       09/27/2016           Expected Discharge Plan:  Home w Hospice Care  In-House Referral:  Hospice / Palliative Care  Discharge planning Services  CM Consult  Post Acute Care Choice:    Choice offered to:     DME Arranged:    DME Agency:     HH Arranged:    HH Agency:     Status of Service:  In process, will continue to follow  If discussed at Long Length of Stay Meetings, dates discussed:    Additional Comments:  Vahan Wadsworth, Chauncey Reading, RN 09/26/2016, 11:17 AM

## 2016-09-26 NOTE — Progress Notes (Signed)
Initial Nutrition Assessment  DOCUMENTATION CODES:  Underweight, Severe malnutrition in context of chronic illness  INTERVENTION:  Appreciate Nursing with feeding assistance  Will try Magic cup BID, each supplement provides 290 kcal and 9 grams of protein  NUTRITION DIAGNOSIS:  Malnutrition related to chronic illness (dementia), dysphagia, lethargy/confusion, poor appetite as evidenced by severe depletion of muscle mass, severe depletion of body fat.  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 Presented with increased confusion. Found to be septic w/ PNA+ UTI.   Pt has severe dementia and is verylimited in her ability to interact.   Per notes, pt has  been with hospice, however recently stopped. Palliative is following. Pt was just recently admitted to [facility]. At that time, daughter had stated pt was on pureed foods and thickened liquids (NTL) at home which is the diet order she was placed on this morning.   Pt could not verbalize any food preferences. She appears to have an appetite as she will open her mouth when she knows it is meal time. She didn't respond to questions about n/v/c/d.   NFPE: She is cachectic and has severe fat/muscle wasting. This is chronic and was documented at an admission 2 months ago.   Medications: IV abx, Labs reviewed: Severe hypokalemia, WBC: 15.1, H/H: 8.3/26.9, albumin: 1.9  Recent Labs Lab 09/23/16 2220 09/24/16 0604 09/25/16 0635  NA 145 145 142  K 2.9* 2.7* 2.9*  CL 115* 115* 114*  CO2 25 24 25   BUN 25* 22* 20  CREATININE 0.33* <0.30* <0.30*  CALCIUM 8.7* 8.2* 8.3*  GLUCOSE 120* 108* 69   Diet Order:  DIET - DYS 1 Room service appropriate? Yes; Fluid consistency: Nectar Thick  Skin: DTI to L hip, 2 stage 2 PUs and unstageable to  medial Sacrum,  unstageable wound to L hip  Last BM:  10/3- incontinent  Height:  Ht Readings from Last 1 Encounters:  09/23/16 5' 6"  (1.676 m)   Weight:  Wt Readings from Last 1 Encounters:  09/23/16 90 lb (40.8 kg)   Wt Readings from Last 10 Encounters:  09/23/16 90 lb (40.8 kg)  09/05/16 110 lb 3.7 oz (50 kg)  07/21/16 114 lb 8 oz (51.9 kg)  10/27/15 158 lb 4.8 oz (71.8 kg)  07/22/15 210 lb (95.3 kg)   Ideal Body Weight:  54.36 kg (Adjusted for AKA)  BMI:  Adjusted Body mass index is 15.8 kg/m.  Estimated Nutritional Needs:  Kcal:  1450-1650 kca;s (35-40 kcal/kg bw) Protein:  61-82 g (1.5-2g/kg bw) Fluid:  1.2 liters (30 ml/kg bw)  EDUCATION NEEDS:  No education needs identified at this time  Burtis Junes RD, LDN, [place] [facility] Pager: [phone] 09/26/2016 12:38 PM

## 2016-09-26 NOTE — Progress Notes (Signed)
PROGRESS NOTE    Debra Hoffman  [id number] DOB: 09/04/1941 DOA: 09/23/2016 PCP: Sabino Snipes KEY, MD    Brief Narrative:  75 yo with advanced dementia, CVA, CAD, Seizure, DM, chronic ischemic limb, HTN, indwelling foley catheter, under hospice care, admitted for more confusion, sepsis due to UTI, and possible PNA. She is unresponsive. Family doesn't want to have her returned to her hospice facility. She is getting IV Van/Zosyn, but family doesn't want pressor and she is a DNR/DNI. She is a little more responsive.  She is better and is asking for food.   Assessment & Plan:   Principal Problem:   Sepsis ([place]) Active Problems:   Pneumonia   Protein-calorie malnutrition, severe   DNR (do not resuscitate)   Lower limb ischemia   UTI (lower urinary tract infection)   Cerebrovascular accident (CVA) ([place])   Dementia without behavioral disturbance   1. Sepsis due to UTI in patient with advanced dementia and is under hospice care: Maintain DNR/DNI Code status. Appreciate palliative care consultation.  Family would like  Take her home to [facility] with hospice help.  Doesn't want to return to [facility] or [facility]. She is more alert today, asking for food. Will advance her diet and transition her to an oral antibiotic. Watch for fever.    DVT prophylaxis: Lovenox  Code Status: FULL CODE  Family Communication: No family bedside  Disposition Plan: Discharge home once improved.    Consultants:   None   Procedures:  None   Antimicrobials:   Vancomycin 10/1 >>   Subjective: Doesn't want to go home.She's hungry.   Objective: Vitals:   09/25/16 0500 09/25/16 1400 09/25/16 2108 09/26/16 0635  BP: 125/72 119/68 133/67 118/64  Pulse: 85 77 87 88  Resp: 16 16 16 16   Temp: 98.5 F (36.9 C) 98.3 F (36.8 C) 97.9 F (36.6 C) 98 F (36.7 C)  TempSrc: Axillary  Axillary Axillary  SpO2: 100% 100% 100% 100%  Weight:      Height:        Intake/Output  Summary (Last 24 hours) at 09/26/16 1124 Last data filed at 09/25/16 2314  Gross per 24 hour  Intake              250 ml  Output                0 ml  Net              250 ml   Filed Weights   09/23/16 2200  Weight: 40.8 kg (90 lb)    Examination:  General exam: Appears calm and comfortable  Respiratory system: Clear to auscultation. Respiratory effort normal. Cardiovascular system: S1 & S2 heard, RRR. No JVD, murmurs, rubs, gallops or clicks. No pedal edema. Gastrointestinal system: Abdomen is nondistended, soft and nontender. No organomegaly or masses felt. Normal bowel sounds heard. Central nervous system: Alert and oriented. No focal neurological deficits. Extremities: Symmetric 5 x 5 power. Skin: No rashes, lesions or ulcers Psychiatry: Judgement and insight appear normal. Mood & affect appropriate.     Data Reviewed: I have personally reviewed following labs and imaging studies  CBC:  Recent Labs Lab 09/23/16 2220 09/24/16 0604 09/25/16 0635  WBC 12.6* 15.1* 15.1*  NEUTROABS 11.1*  --   --   HGB 10.0* 9.2* 8.3*  HCT 32.7* 30.4* 26.9*  MCV 83.2 83.3 82.8  PLT 328 290 0000000   Basic Metabolic Panel:  Recent Labs Lab 09/23/16 2220 09/24/16 0604 09/25/16  0635  NA 145 145 142  K 2.9* 2.7* 2.9*  CL 115* 115* 114*  CO2 25 24 25   GLUCOSE 120* 108* 69  BUN 25* 22* 20  CREATININE 0.33* <0.30* <0.30*  CALCIUM 8.7* 8.2* 8.3*   GFR: CrCl cannot be calculated (This lab value cannot be used to calculate CrCl because it is not a number: <0.30). Liver Function Tests:  Recent Labs Lab 09/23/16 2220  AST 13*  ALT 6*  ALKPHOS 69  BILITOT 1.1  PROT 6.6  ALBUMIN 1.9*   CBG:  Recent Labs Lab 09/23/16 2157  GLUCAP 108*   Sepsis Labs:  Recent Labs Lab 09/23/16 2233  LATICACIDVEN 1.62    Recent Results (from the past 240 hour(s))  Urine culture     Status: Abnormal   Collection Time: 09/23/16 10:05 PM  Result Value Ref Range Status   Specimen  Description URINE, CLEAN CATCH  Final   Special Requests NONE  Final   Culture >=100,000 COLONIES/mL YEAST (A)  Final   Report Status 09/26/2016 FINAL  Final  Blood Culture (routine x 2)     Status: Abnormal   Collection Time: 09/23/16 10:20 PM  Result Value Ref Range Status   Specimen Description BLOOD RIGHT FOREARM DRAWN BY RN  Final   Special Requests BOTTLES DRAWN AEROBIC AND ANAEROBIC [facility] EACH  Final   Culture  Setup Time   Final    GRAM POSITIVE COCCI Gram Stain Report Called to,Read Back By and Verified With: [place], M AT 1756 ON 09/24/2016 BY EVA OBTAINED FROM ANAEROEBIC BOTTLE    Culture (A)  Final    STAPHYLOCOCCUS SPECIES (COAGULASE NEGATIVE) THE SIGNIFICANCE OF ISOLATING THIS ORGANISM FROM A SINGLE SET OF BLOOD CULTURES WHEN MULTIPLE SETS ARE DRAWN IS UNCERTAIN. PLEASE NOTIFY THE MICROBIOLOGY DEPARTMENT WITHIN ONE WEEK IF SPECIATION AND SENSITIVITIES ARE REQUIRED. Performed at [facility]    Report Status 09/26/2016 FINAL  Final  Blood Culture ID Panel (Reflexed)     Status: Abnormal   Collection Time: 09/23/16 10:20 PM  Result Value Ref Range Status   Enterococcus species NOT DETECTED NOT DETECTED Final   Vancomycin resistance NOT DETECTED NOT DETECTED Final   Listeria monocytogenes NOT DETECTED NOT DETECTED Final   Staphylococcus species DETECTED (A) NOT DETECTED Final    Comment: CRITICAL RESULT CALLED TO, READ BACK BY AND VERIFIED WITH: TO  BSTOCUM(RN) BY TCLEVELAND 09/25/16 AT 1:26AM     Staphylococcus aureus NOT DETECTED NOT DETECTED Final   Methicillin resistance DETECTED (A) NOT DETECTED Final    Comment: CRITICAL RESULT CALLED TO, READ BACK BY AND VERIFIED WITH: TO  BSTOCUM(RN) BY TCLEVELAND 09/25/16 AT 1:26AM    Streptococcus species NOT DETECTED NOT DETECTED Final   Streptococcus agalactiae NOT DETECTED NOT DETECTED Final   Streptococcus pneumoniae NOT DETECTED NOT DETECTED Final   Streptococcus pyogenes NOT DETECTED NOT DETECTED Final    Acinetobacter baumannii NOT DETECTED NOT DETECTED Final   Enterobacteriaceae species NOT DETECTED NOT DETECTED Final   Enterobacter cloacae complex NOT DETECTED NOT DETECTED Final   Escherichia coli NOT DETECTED NOT DETECTED Final   Klebsiella oxytoca NOT DETECTED NOT DETECTED Final   Klebsiella pneumoniae NOT DETECTED NOT DETECTED Final   Proteus species NOT DETECTED NOT DETECTED Final   Serratia marcescens NOT DETECTED NOT DETECTED Final   Carbapenem resistance NOT DETECTED NOT DETECTED Final   Haemophilus influenzae NOT DETECTED NOT DETECTED Final   Neisseria meningitidis NOT DETECTED NOT DETECTED Final   Pseudomonas aeruginosa NOT DETECTED  NOT DETECTED Final   Candida albicans NOT DETECTED NOT DETECTED Final   Candida glabrata NOT DETECTED NOT DETECTED Final   Candida krusei NOT DETECTED NOT DETECTED Final   Candida parapsilosis NOT DETECTED NOT DETECTED Final   Candida tropicalis NOT DETECTED NOT DETECTED Final    Comment: Performed at [facility]  Blood Culture (routine x 2)     Status: None (Preliminary result)   Collection Time: 09/24/16  6:16 AM  Result Value Ref Range Status   Specimen Description BLOOD LEFT WRIST DRAWN BY RN  Final   Special Requests BOTTLES DRAWN AEROBIC ONLY 4CC  Final   Culture NO GROWTH 2 DAYS  Final   Report Status PENDING  Incomplete     Radiology Studies: No results found.  Scheduled Meds: . divalproex  250 mg Oral BID  . enoxaparin (LOVENOX) injection  30 mg Subcutaneous Q24H  . piperacillin-tazobactam (ZOSYN)  IV  3.375 g Intravenous Q8H  . scopolamine  1 patch Transdermal Q72H  . vancomycin  750 mg Intravenous Q24H   Continuous Infusions:   LOS: 3 days  Time spent: 25 minutes    Orvan Falconer, MD FACP [facility] If 7PM-7AM, please contact night-coverage [email or web address] Password [email or web address] 09/26/2016, 11:24 AM   By signing my name below, I, Collene Leyden, attest that this documentation has been prepared under the direction and in  the presence of Orvan Falconer, MD. Electronically signed: Collene Leyden, Scribe.09/26/16 11:29 AM

## 2016-09-26 NOTE — Care Management (Signed)
PCP  SOLES, [name]   Demographics  PATIENT WILL DISCHARGE TO ADDRESS : [address] , [place] Comment    Last edited by  on at   Address: Home Phone: Work Phone: Mobile Phone:  [address]  [place] [address]   [phone] -- [phone]  SSN: Insurance: Marital Status: Religion:  [id number] MEDICARE Unknown Unknown  Patient Ethnicity & Race   Ethnic Group Patient Race  Not Hispanic or Latino Black or African American  Documents Filed to Patient   Power of Attorney Living Will Clinical Unknown Study Attachment Consent Form ABN Waiver After Visit Summary Lab Result Scan Code Status MyChart Status Advance Care Planning  Not on File Not on File Not on File Not on File Filed Not on File Filed Not on File DNR [Updated on 09/24/16 0120] Pending Jump to the Activity  Admission Information   Attending Provider Admitting Provider Admission Type Admission Date/Time  Orvan Falconer, MD Phillips Grout, MD  09/23/16 2202  Discharge Date Hospital Service Auth/Cert Status Service Area   Internal Medicine Incomplete [place]  Unit Room/Bed Admission Status   AP-DEPT 300 A325/A325-01 Admission (Confirmed)   Hospital Account   Name Acct ID Class Status Primary Coverage  Debra Hoffman, Debra Hoffman [id number] Inpatient Open MEDICARE - MEDICARE PART A AND B      Guarantor Account (for Hospital Account [id number])   Name Relation to Pt Service Area Active? Acct Type  Debra Hoffman Self [facility]SA Yes Personal/Family  Address Phone    [address] [place], [place] [address] [phone])        Coverage Information (for Hospital Account [id number])   1. [name] PART A AND B   F/O Payor/Plan Precert #  MEDICARE/MEDICARE PART A AND B   Subscriber Subscriber #  Debra Hoffman, Debra Hoffman [id number] D  Address Phone  [address] [place], [place] [address]   2. MEDICAID [place]/MEDICAID OF [place]   F/O Payor/Plan Precert #  MEDICAID [place]/MEDICAID OF [place]   Subscriber Subscriber #   Debra Hoffman, Debra Hoffman [id number] [facility]  Address Phone  [address] [place],  [address] [phone]

## 2016-09-26 NOTE — Care Management (Signed)
Spoke with Gregary Signs from [facility], they are not able to accept patient unless [name] is willing for patient to go to the [facility].This is due to concern of safety of their employees considering that the home of the patient could be unsafe. Notified [name], she maintains that she does not want her grandmother in a hospice facility at this time. She asks that we give her some notice in the morning if her grandmother is being discharged so that she has time to come get her. I explain that the plan is for [name] to be discharged tomorrow if the night is uneventful for the patient.

## 2016-09-26 NOTE — Care Management (Signed)
Spoke with POA [name]) again to update her on plan. Patient will be transitioned to oral antibiotics and most likely discharge tomorrow. Talked with [name] at length about hospice and how this could offer her some support in caring for her grandmother. She is agreeable to me faxing a referral to [facility] and having them call her. I have spoken with [facility] Caswell to update them on Ms. Ellner and all the family dynamics at play and have faxed over necessary information.

## 2016-09-27 DIAGNOSIS — G300 Alzheimer's disease with early onset: Secondary | ICD-10-CM

## 2016-09-27 DIAGNOSIS — A419 Sepsis, unspecified organism: Principal | ICD-10-CM

## 2016-09-27 LAB — BASIC METABOLIC PANEL
ANION GAP: 4 — AB (ref 5–15)
BUN: 20 mg/dL (ref 6–20)
CALCIUM: 7.9 mg/dL — AB (ref 8.9–10.3)
CO2: 24 mmol/L (ref 22–32)
Chloride: 117 mmol/L — ABNORMAL HIGH (ref 101–111)
Creatinine, Ser: <0.3 mg/dL — ABNORMAL LOW (ref 0.44–1.00)
Glucose, Bld: 124 mg/dL — ABNORMAL HIGH (ref 65–99)
Potassium: 2.4 mmol/L — CL (ref 3.5–5.1)
SODIUM: 145 mmol/L (ref 135–145)

## 2016-09-27 LAB — MAGNESIUM: MAGNESIUM: 1.9 mg/dL (ref 1.7–2.4)

## 2016-09-27 MED ORDER — FLUCONAZOLE 100 MG PO TABS: 100.0000 mg | ORAL_TABLET | Freq: Every day | ORAL | 0 refills | Status: AC

## 2016-09-27 MED ORDER — AMOXICILLIN-POT CLAVULANATE 875-125 MG PO TABS: 1.0000 | ORAL_TABLET | Freq: Two times a day (BID) | ORAL | 0 refills | Status: DC

## 2016-09-27 MED ORDER — POTASSIUM CHLORIDE CRYS ER 20 MEQ PO TBCR
40.0000 meq | EXTENDED_RELEASE_TABLET | ORAL | Status: DC
  Administered 2016-09-27 (×3): 40 meq via ORAL
  Filled 2016-09-27 (×3): qty 2

## 2016-09-27 NOTE — Progress Notes (Signed)
Pt IV removed, tolerated well. Reviewed discharge instructions with pt family, answered questions at this time.

## 2016-09-27 NOTE — Discharge Summary (Signed)
Physician Discharge Summary  Debra Hoffman [id number] DOB: 03/14/1941 DOA: 09/23/2016  PCP: Sabino Snipes KEY, MD  Admit date: 09/23/2016 Discharge date: 09/27/2016  Time spent: 45 minutes  Recommendations for Outpatient Follow-up:  -Will be discharged home today. -Advised to follow-up with primary care provider in 2 weeks.   Discharge Diagnoses:  Principal Problem:   Sepsis ([place]) Active Problems:   Pneumonia   Protein-calorie malnutrition, severe   DNR (do not resuscitate)   Lower limb ischemia   UTI (lower urinary tract infection)   Cerebrovascular accident (CVA) ([name])   Dementia without behavioral disturbance   Discharge Condition: Stable and improved  Filed Weights   09/23/16 2200  Weight: 40.8 kg (90 lb)    History of present illness:  Debra Hoffman is a 75 y.o. female with medical history significant of advanced dementia, CAD, CVA, seizures, chronic ischemic limb, DM, HTN, chronic indwelling foley brought in by family for more confused than normal.  All history obtained from granddaughter.  Reports she was unresponsive earlier and would not awaken.  She has been eating on/off.  She aspirates on occasion.  Family had her at a hospice house about 3 weeks and they claim she was overdosed by the hospice house so they took her out and brought her home.  They are therefore not interested in hospice again.  Pt is on pain  meds and sometimes get more sedated after taking meds, but today it was worse than normal.  No fevers.  No n/v/d.  No cough.  At her baseline, she can sit up in a wheelchair and speak but half the time does not make sense when she speaks and half the time does not recognize her family members.  Pt found to have uti and possible pna.  Referred for admission for sepsis.  Hospital Course:   Sepsis due to UTI in a patient with advanced dementia under hospice care -Family does not want her to return to residential hospice, she refuses to consider  further withdrawal of care despite multiple conversations by physicians in palliative care. They're planning on taking patient back home. -One out of 2 blood cultures have grown coag-negative staph, likely representing a contaminant, urine has grown out yeast. -We'll be discharged on a seven-day course of Diflucan.  Rest of chronic conditions have been stable   Procedures:  None   Consultations:  None  Discharge Instructions  Discharge Instructions    Increase activity slowly    Complete by:  As directed        Medication List    TAKE these medications   divalproex 125 MG capsule Commonly known as:  DEPAKOTE SPRINKLE Take 250 mg by mouth 2 (two) times daily.   ferrous sulfate 220 (44 Fe) MG/5ML solution Take 220 mg by mouth 2 (two) times daily with a meal.   fluconazole 100 MG tablet Commonly known as:  DIFLUCAN Take 1 tablet (100 mg total) by mouth daily.   haloperidol 2 MG/ML solution Commonly known as:  HALDOL Take 2 mg by mouth every 4 (four) hours as needed for agitation.   oxyCODONE 20 MG/ML concentrated solution Commonly known as:  ROXICODONE INTENSOL Take 5 mg by mouth every 2 (two) hours as needed for severe pain.   potassium chloride 10 MEQ tablet Commonly known as:  K-DUR,KLOR-CON Take 10 mEq by mouth 2 (two) times daily.   scopolamine 1 MG/3DAYS Commonly known as:  TRANSDERM-SCOP Place 1 patch onto the skin every 3 (three) days.  Allergies  Allergen Reactions  . Ativan [Lorazepam]   . Shellfish Allergy     Reaction: unknown   Follow-up Information    SOLES, [name], MD. Schedule an appointment as soon as possible for a visit in 2 week(s).   Specialty:  Family Medicine Contact information: [address] [place] [address] [phone]            The results of significant diagnostics from this hospitalization (including imaging, microbiology, ancillary and laboratory) are listed below for reference.     Significant Diagnostic Studies: Ct Head Wo Contrast  Result Date: 09/23/2016 CLINICAL DATA:  Altered mental status. Fever. Unresponsive since 2000 hours. EXAM: CT HEAD WITHOUT CONTRAST TECHNIQUE: Contiguous axial images were obtained from the base of the skull through the vertex without intravenous contrast. COMPARISON:  MRI brain 10/26/2015.  CT head 10/25/2015. FINDINGS: Brain: Diffuse bilateral cerebral atrophy. Prominent CSF spaces along the frontoparietal convexity bilaterally, likely representing subdural hygromas. No change since prior study. Mild ventricular dilatation consistent with central atrophy. Low-attenuation changes throughout the deep white matter consistent with small vessel ischemia. No mass effect or midline shift. No abnormal extra-axial fluid collections. Gray-white matter junctions are distinct. Basal cisterns are not effaced. No evidence of acute intracranial hemorrhage. Vascular: Atherosclerotic vascular calcifications are present. Skull: Normal. Negative for fracture or focal lesion. Sinuses/Orbits: Retention cysts in the sphenoid sinuses and left maxillary antrum. No acute air-fluid levels in the paranasal sinuses. Mastoid air cells are not opacified. Other: No acute changes since previous study. IMPRESSION: No acute intracranial abnormalities. Chronic atrophy and small vessel ischemic changes. Chronic bilateral subdural hygromas. Electronically Signed   By: Lucienne Capers M.D.   On: 09/23/2016 23:29   Dg Chest Port 1 View  Result Date: 09/23/2016 CLINICAL DATA:  Acute onset of fever and unresponsiveness. Code sepsis. Initial encounter. EXAM: PORTABLE CHEST 1 VIEW COMPARISON:  Chest radiograph from 09/04/2016 FINDINGS: The lungs are well-aerated. Minimal left basilar opacity may reflect atelectasis or mild infection, depending on the patient's symptoms. There is no evidence of pleural effusion or pneumothorax. The cardiomediastinal silhouette is normal in size. The patient is  status post median sternotomy. No acute osseous abnormalities are seen. IMPRESSION: Minimal left basilar opacity may reflect atelectasis or possibly mild infection, depending on the patient's symptoms. Electronically Signed   By: Garald Balding M.D.   On: 09/23/2016 22:48   Dg Chest Port 1 View  Result Date: 09/04/2016 CLINICAL DATA:  Unresponsive, chest rattle EXAM: PORTABLE CHEST 1 VIEW COMPARISON:  10/25/2015 FINDINGS: Patchy left upper lobe/ lingular opacity, suspicious for pneumonia. Additional mild right upper lobe opacity. Possible mild blunting of the right costophrenic angle. No pneumothorax. The heart is normal in size. Median sternotomy. IMPRESSION: Left upper lobe/ lingular pneumonia. Electronically Signed   By: Julian Hy M.D.   On: 09/04/2016 10:47    Microbiology: Recent Results (from the past 240 hour(s))  Urine culture     Status: Abnormal   Collection Time: 09/23/16 10:05 PM  Result Value Ref Range Status   Specimen Description URINE, CLEAN CATCH  Final   Special Requests NONE  Final   Culture >=100,000 COLONIES/mL YEAST (A)  Final   Report Status 09/26/2016 FINAL  Final  Blood Culture (routine x 2)     Status: Abnormal   Collection Time: 09/23/16 10:20 PM  Result Value Ref Range Status   Specimen Description BLOOD RIGHT FOREARM DRAWN BY RN  Final   Special Requests BOTTLES DRAWN AEROBIC AND ANAEROBIC  [place]  Final   Culture  Setup Time   Final    GRAM POSITIVE COCCI Gram Stain Report Called to,Read Back By and Verified With: DISHMON, M AT 1756 ON 09/24/2016 BY EVA OBTAINED FROM ANAEROEBIC BOTTLE    Culture (A)  Final    STAPHYLOCOCCUS SPECIES (COAGULASE NEGATIVE) THE SIGNIFICANCE OF ISOLATING THIS ORGANISM FROM A SINGLE SET OF BLOOD CULTURES WHEN MULTIPLE SETS ARE DRAWN IS UNCERTAIN. PLEASE NOTIFY THE MICROBIOLOGY DEPARTMENT WITHIN ONE WEEK IF SPECIATION AND SENSITIVITIES ARE REQUIRED. Performed at [facility]    Report Status 09/26/2016 FINAL  Final    Blood Culture ID Panel (Reflexed)     Status: Abnormal   Collection Time: 09/23/16 10:20 PM  Result Value Ref Range Status   Enterococcus species NOT DETECTED NOT DETECTED Final   Vancomycin resistance NOT DETECTED NOT DETECTED Final   Listeria monocytogenes NOT DETECTED NOT DETECTED Final   Staphylococcus species DETECTED (A) NOT DETECTED Final    Comment: CRITICAL RESULT CALLED TO, READ BACK BY AND VERIFIED WITH: TO  BSTOCUM(RN) BY TCLEVELAND 09/25/16 AT 1:26AM     Staphylococcus aureus NOT DETECTED NOT DETECTED Final   Methicillin resistance DETECTED (A) NOT DETECTED Final    Comment: CRITICAL RESULT CALLED TO, READ BACK BY AND VERIFIED WITH: TO  BSTOCUM(RN) BY TCLEVELAND 09/25/16 AT 1:26AM    Streptococcus species NOT DETECTED NOT DETECTED Final   Streptococcus agalactiae NOT DETECTED NOT DETECTED Final   Streptococcus pneumoniae NOT DETECTED NOT DETECTED Final   Streptococcus pyogenes NOT DETECTED NOT DETECTED Final   Acinetobacter baumannii NOT DETECTED NOT DETECTED Final   Enterobacteriaceae species NOT DETECTED NOT DETECTED Final   Enterobacter cloacae complex NOT DETECTED NOT DETECTED Final   Escherichia coli NOT DETECTED NOT DETECTED Final   Klebsiella oxytoca NOT DETECTED NOT DETECTED Final   Klebsiella pneumoniae NOT DETECTED NOT DETECTED Final   Proteus species NOT DETECTED NOT DETECTED Final   Serratia marcescens NOT DETECTED NOT DETECTED Final   Carbapenem resistance NOT DETECTED NOT DETECTED Final   Haemophilus influenzae NOT DETECTED NOT DETECTED Final   Neisseria meningitidis NOT DETECTED NOT DETECTED Final   Pseudomonas aeruginosa NOT DETECTED NOT DETECTED Final   Candida albicans NOT DETECTED NOT DETECTED Final   Candida glabrata NOT DETECTED NOT DETECTED Final   Candida krusei NOT DETECTED NOT DETECTED Final   Candida parapsilosis NOT DETECTED NOT DETECTED Final   Candida tropicalis NOT DETECTED NOT DETECTED Final    Comment: Performed at [facility]   Blood Culture (routine x 2)     Status: None (Preliminary result)   Collection Time: 09/24/16  6:16 AM  Result Value Ref Range Status   Specimen Description BLOOD LEFT WRIST DRAWN BY RN  Final   Special Requests BOTTLES DRAWN AEROBIC ONLY 4CC  Final   Culture NO GROWTH 3 DAYS  Final   Report Status PENDING  Incomplete     Labs: Basic Metabolic Panel:  Recent Labs Lab 09/23/16 2220 09/24/16 0604 09/25/16 0635 09/27/16 0634  NA 145 145 142 145  K 2.9* 2.7* 2.9* 2.4*  CL 115* 115* 114* 117*  CO2 25 24 25 24   GLUCOSE 120* 108* 69 124*  BUN 25* 22* 20 20  CREATININE 0.33* <0.30* <0.30* <0.30*  CALCIUM 8.7* 8.2* 8.3* 7.9*  MG  --   --   --  1.9   Liver Function Tests:  Recent Labs Lab 09/23/16 2220  AST 13*  ALT 6*  ALKPHOS 69  BILITOT  1.1  PROT 6.6  ALBUMIN 1.9*   No results for input(s): LIPASE, AMYLASE in the last 168 hours. No results for input(s): AMMONIA in the last 168 hours. CBC:  Recent Labs Lab 09/23/16 2220 09/24/16 0604 09/25/16 0635  WBC 12.6* 15.1* 15.1*  NEUTROABS 11.1*  --   --   HGB 10.0* 9.2* 8.3*  HCT 32.7* 30.4* 26.9*  MCV 83.2 83.3 82.8  PLT 328 290 296   Cardiac Enzymes: No results for input(s): CKTOTAL, CKMB, CKMBINDEX, TROPONINI in the last 168 hours. BNP: BNP (last 3 results) No results for input(s): BNP in the last 8760 hours.  ProBNP (last 3 results) No results for input(s): PROBNP in the last 8760 hours.  CBG:  Recent Labs Lab 09/23/16 2157  GLUCAP 108*       Signed:  Lelon Frohlich  [facility] Pager: [phone] 09/27/2016, 3:21 PM

## 2016-09-27 NOTE — Care Management Important Message (Signed)
Important Message  Patient Details  Name: Debra Hoffman MRN: [id number] Date of Birth: [birth year]   Medicare Important Message Given:  Yes    Sherald Barge, RN 09/27/2016, 2:09 PM

## 2016-09-27 NOTE — Progress Notes (Signed)
Daily Progress Note   Patient Name: Debra Hoffman       Date: 09/27/2016 DOB: [birth year]  Age: 75 y.o. MRN#: [id number] Attending Physician: Mikki Harbor* Primary Care Physician: Sabino Snipes KEY, MD Admit Date: 09/23/2016  Reason for Consultation/Follow-up: Establishing goals of care and Psychosocial/spiritual support  Subjective: Debra Hoffman is resting quietly in bed, no family at bedside at this time. She does not open eyes to command. She will open her mouth and I'm able to feed her a magic cup supplement. There are no overt signs of aspiration.   Debra Hoffman does not try to communicate with me in any meaningful way.   Length of Stay: 4  Current Medications: Scheduled Meds:  . amoxicillin-clavulanate  1 tablet Oral Q12H  . divalproex  250 mg Oral BID  . enoxaparin (LOVENOX) injection  30 mg Subcutaneous Q24H  . potassium chloride  40 mEq Oral Q2H  . scopolamine  1 patch Transdermal Q72H    Continuous Infusions:    PRN Meds: haloperidol, ondansetron **OR** ondansetron (ZOFRAN) IV, oxyCODONE  Physical Exam  Constitutional: No distress.  Frail, thin, chronically ill appearing  HENT:  Head: Normocephalic and atraumatic.  Severe temporal wasting  Cardiovascular: Normal rate and regular rhythm.   Pulmonary/Chest: Effort normal. No respiratory distress.  Abdominal: Soft. She exhibits no distension.  Flat abdomen, cachectic  Musculoskeletal:  Severe muscle wasting  Neurological:  Does not open eyes  Skin: Skin is warm and dry.  Nursing note and vitals reviewed.           Vital Signs: BP 139/74 (BP Location: Right Arm)   Pulse 87   Temp 99 F (37.2 C) (Oral)   Resp 16   Ht 5\' 6"  (1.676 m)   Wt 40.8 kg (90 lb)   SpO2 100%   BMI 14.53 kg/m  SpO2:  SpO2: 100 % O2 Device: O2 Device: Not Delivered O2 Flow Rate:    Intake/output summary:  Intake/Output Summary (Last 24 hours) at 09/27/16 1509 Last data filed at 09/27/16 1000  Gross per 24 hour  Intake              240 ml  Output                0 ml  Net  240 ml   LBM: Last BM Date: 09/26/16 Baseline Weight: Weight: 40.8 kg (90 lb) Most recent weight: Weight: 40.8 kg (90 lb)       Palliative Assessment/Data:    Flowsheet Rows   Flowsheet Row Most Recent Value  Intake Tab  Referral Department  Hospitalist  Unit at Time of Referral  Med/Surg Unit  Palliative Care Primary Diagnosis  Sepsis/Infectious Disease  Date Notified  09/24/16  Palliative Care Type  Return patient Palliative Care  Reason for referral  Clarify Goals of Care, End of Life Care Assistance  Date of Admission  09/23/16  Date first seen by Palliative Care  09/25/16  # of days Palliative referral response time  1 Day(s)  # of days IP prior to Palliative referral  1  Clinical Assessment  Palliative Performance Scale Score  20%  Pain Max last 24 hours  Not able to report  Pain Min Last 24 hours  Not able to report  Dyspnea Max Last 24 Hours  Not able to report  Dyspnea Min Last 24 hours  Not able to report  Psychosocial & Spiritual Assessment  Palliative Care Outcomes  Patient/Family meeting held?  Yes  Who was at the meeting?  via phone with granddaughter [place]  Provided advance care planning, Provided end of life care assistance, Clarified goals of care  Patient/Family wishes: Interventions discontinued/not started   Mechanical Ventilation  Palliative Care follow-up planned  -- [Follow up while at [facility]]      Patient Active Problem List   Diagnosis Date Noted  . UTI (lower urinary tract infection) 09/24/2016  . Cerebrovascular accident (CVA) ([place])   . Dementia without behavioral disturbance   . Pressure ulcer 09/07/2016  . Aspiration pneumonia ([place])  09/04/2016  . Lower limb ischemia   . Sepsis ([place])   . Goals of care, counseling/discussion   . Palliative care encounter 07/26/2016  . DNR (do not resuscitate) 07/26/2016  . Protein-calorie malnutrition, severe 07/22/2016  . 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 Acute respiratory failure with hypoxia ([place]) 10/27/2015  . Acute encephalopathy 10/27/2015  . Renal insufficiency 10/27/2015  . Sterile pyuria 10/27/2015  . Diabetes mellitus ([place]) 10/27/2015  . Leukocytosis 10/27/2015  . Generalized weakness 10/27/2015  . Dysphagia 10/27/2015  . Elevated troponin   . Pneumonia 10/22/2015    Palliative Care Assessment & Plan   Patient Profile: 75 yo with advanced dementia, CVA, CAD, Seizure, DM, chronic ischemic limb, HTN, indwelling foley catheter, under hospice care, admitted for more confusion, sepsis due to UTI, and possible PNA. She is unresponsive. Family doesn't want to have her returned to her hospice facility. She is getting IV Van/Zosyn, but family doesn't want pressor and she is a DNR/DNI.  Assessment: Disposition to granddaughter/healthcare power of attorney, Lateisha's home in [place] today.   Recommendations/Plan:  Debra Hoffman would benefit from the services of hospice, but hospice provider is unwilling to provide services in [place] home, citing safety concerns. Edward Qualia is unwilling to allow Ms. [place] residential hospice status.  Goals of Care and Additional Recommendations:  Limitations on Scope of Treatment: Debra Hoffman states that she is aware that no surgical intervention is being offered fo Debra Hoffman at this time.  Code Status:    Code Status Orders        Start     Ordered   09/24/16 0121  Do not attempt resuscitation (DNR)  Continuous    Question Answer Comment  In the event of cardiac  or respiratory ARREST Do not call a "code blue"   In the event of cardiac or respiratory ARREST Do not perform Intubation, CPR, defibrillation or ACLS    In the event of cardiac or respiratory ARREST Use medication by any route, position, wound care, and other measures to relive pain and suffering. May use oxygen, suction and manual treatment of airway obstruction as needed for comfort.      09/24/16 0120    Code Status History    Date Active Date Inactive Code Status Order ID Comments User Context   09/04/2016  6:15 PM 09/07/2016  5:53 PM DNR [id number]  Wallis Bamberg, MD Inpatient   09/04/2016  5:10 PM 09/04/2016  6:15 PM DNR [id number]  Wallis Bamberg, MD Inpatient   09/04/2016  4:53 PM 09/04/2016  5:10 PM DNR [id number]  Wallis Bamberg, MD Inpatient   07/21/2016  4:33 PM 07/26/2016  5:49 PM DNR [id number]  Henreitta Leber, MD Inpatient   10/22/2015  9:56 PM 10/27/2015  9:15 PM Full Code [id number]  Theodoro Grist, MD ED    Advance Directive Documentation   Flowsheet Row Most Recent Value  Type of Advance Directive  Healthcare Power of Attorney [letisha garland]  Pre-existing out of facility DNR order (yellow form or pink MOST form)  Physician notified to receive inpatient order  "MOST" Form in Place?  No data       Prognosis:   < 6 weeks, likely based on chronic disease burden related to advanced dementia, poor PO intake, frailty, so you to thrive, complicated by severe vascular disease, necrotic toes.  Discharge Planning:  Disposition to granddaughter/healthcare power of attorney, Lateisha's home in [place] today.   Care plan was discussed with nursing staff, social worker, case manager, and Dr. Candyce Churn.  Thank you for allowing the Palliative Medicine Team to assist in the care of this patient.   Time In: 1020 Time Out: 1040 Total Time 20 minutes  Prolonged Time Billed  no       Greater than 50%  of this time was spent counseling and coordinating care related to the above assessment and plan.  Drue Novel, NP  Please contact Palliative Medicine Team phone at [phone] for questions and concerns.

## 2016-09-29 LAB — CULTURE, BLOOD (ROUTINE X 2): CULTURE: NO GROWTH

## 2016-10-25 DEATH — deceased

## 2017-03-28 LAB — BLOOD GAS, ARTERIAL
ACID-BASE EXCESS: 0.7 mmol/L (ref 0.0–2.0)
BICARBONATE: 25.5 mmol/L (ref 20.0–28.0)
Drawn by: 234301
FIO2: 100
O2 SAT: 94.5 %
PATIENT TEMPERATURE: 37
PO2 ART: 71 mmHg — AB (ref 83.0–108.0)
pCO2 arterial: 31 mmHg — ABNORMAL LOW (ref 32.0–48.0)
pH, Arterial: 7.496 — ABNORMAL HIGH (ref 7.350–7.450)

## 2017-06-06 IMAGING — CR DG CHEST 1V PORT
1 series · 1 of 1 positions shown · non-contrast
Comparison: 10/24/2015.

CLINICAL DATA: Respiratory failure.

EXAM:
PORTABLE CHEST 1 VIEW

[portable]
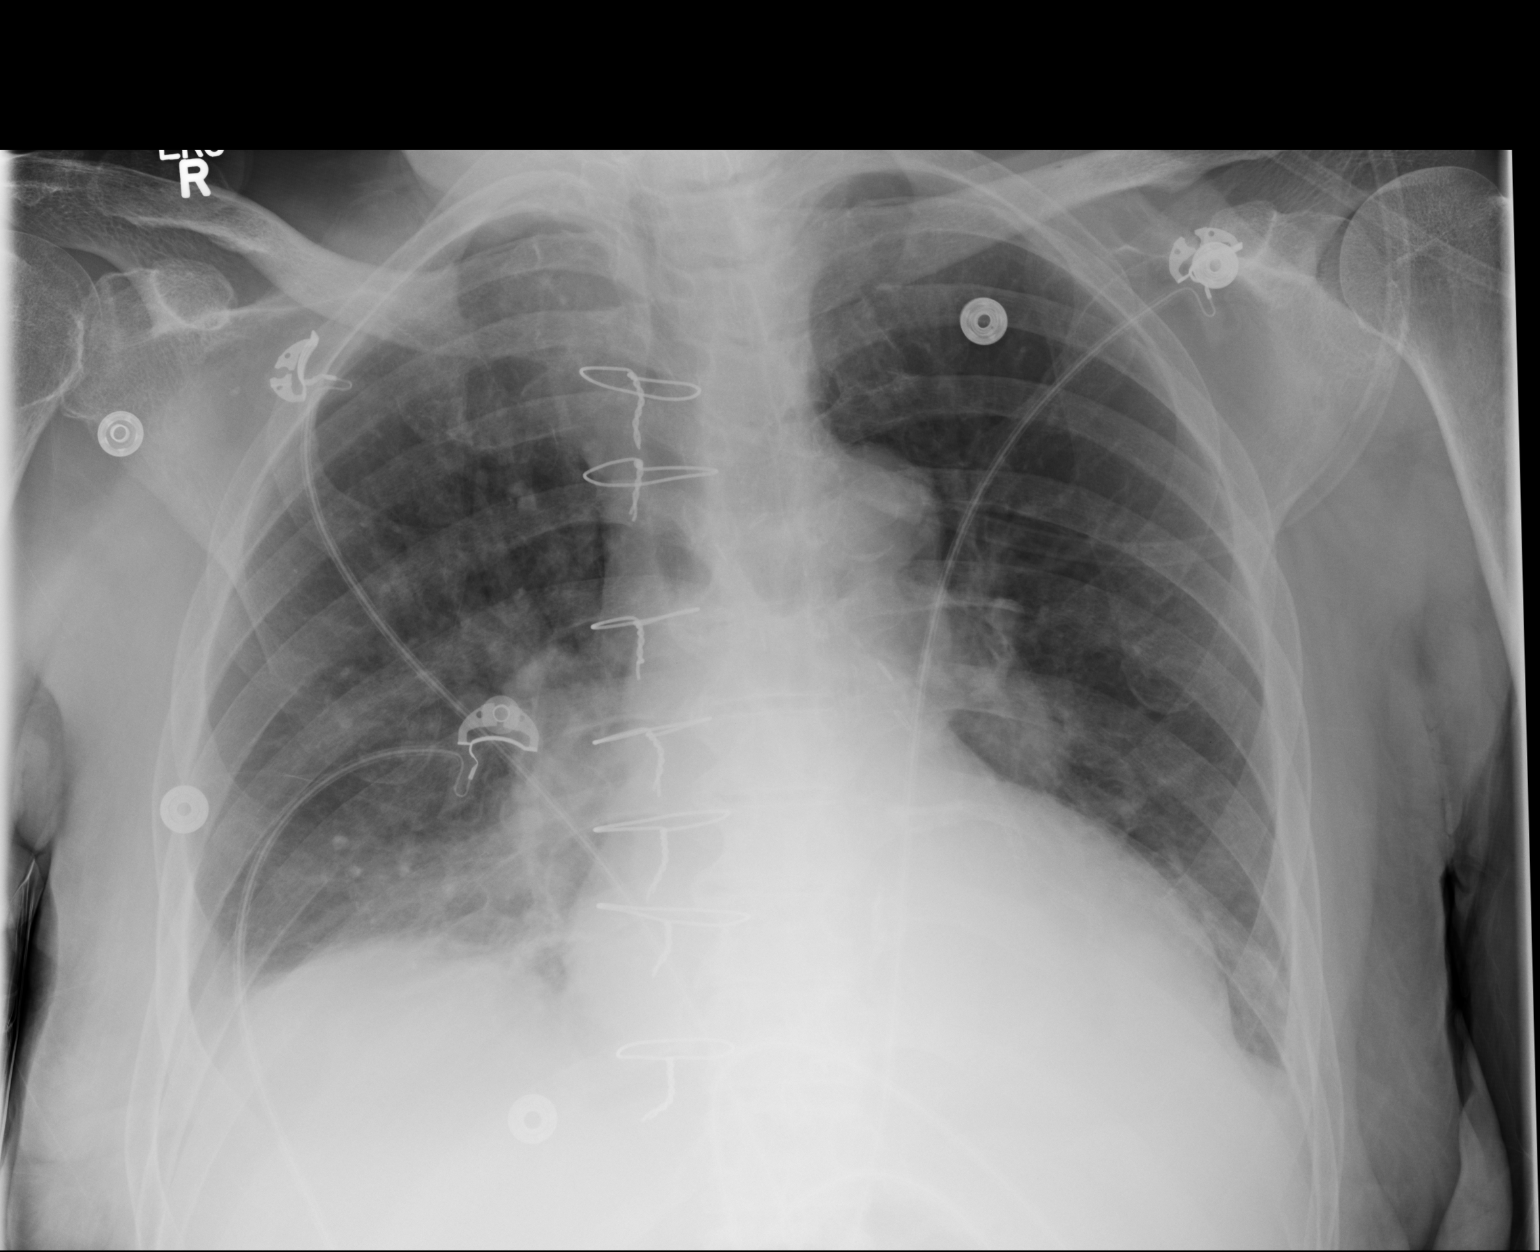

[1 of 1 positions shown; findings below may reference images not displayed]

FINDINGS: Mediastinum hilar structures normal. Prior median sternotomy and
CABG. Cardiomegaly with persistent bibasilar atelectasis and/or
infiltrates. Small pleural effusions cannot be excluded. No
pneumothorax. No acute osseous abnormality .
IMPRESSION: 1. Persistent atelectasis and or infiltrates in the lower lobes.
Small pleural effusions cannot be excluded.
2. Prior CABG .  Stable cardiomegaly.

## 2017-06-06 IMAGING — CT CT HEAD W/O CM
2 series · 15 of 30 positions shown, 19 images · non-contrast
Comparison: CT scan of October 22, 2015.

CLINICAL DATA: Altered mental status.

EXAM:
CT HEAD WITHOUT CONTRAST
TECHNIQUE: Contiguous axial images were obtained from the base of the skull
through the vertex without intravenous contrast.

[Series 2: head wo · axial · 0.46mm/px · z∈[-98,+28]mm · 13 of 31 slices shown, 17 images]
[im 3/31  brain]
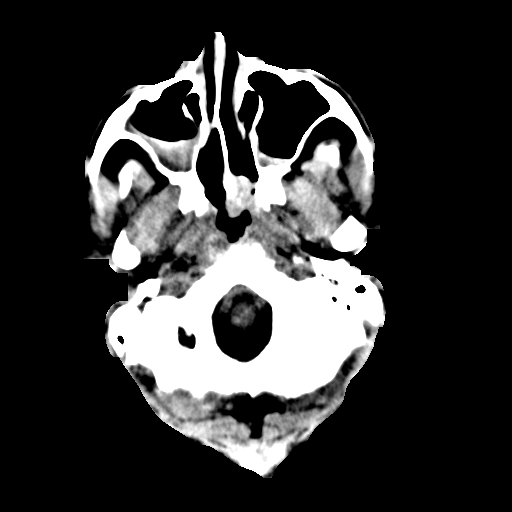
[im 3/31  bone]
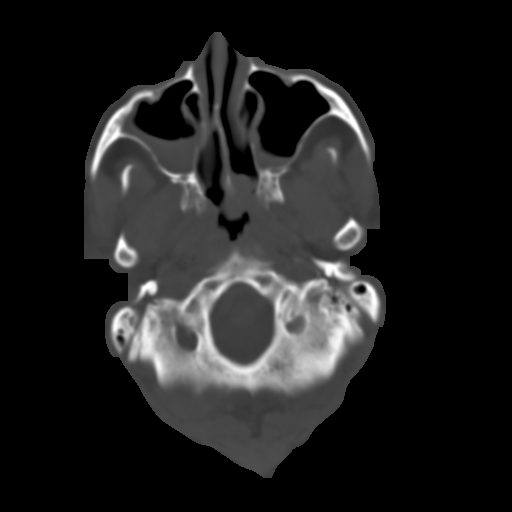
[im 5/31  brain]
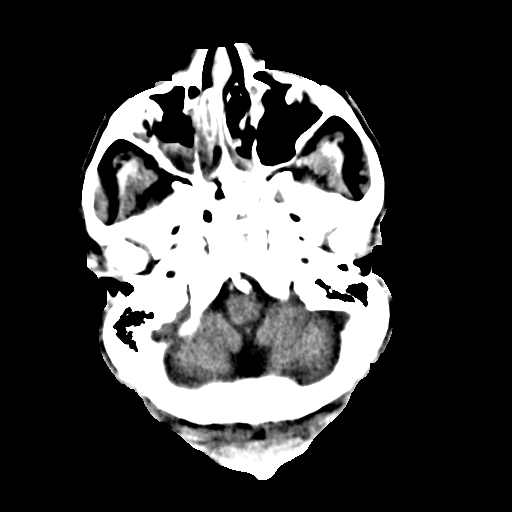
[im 7/31  brain]
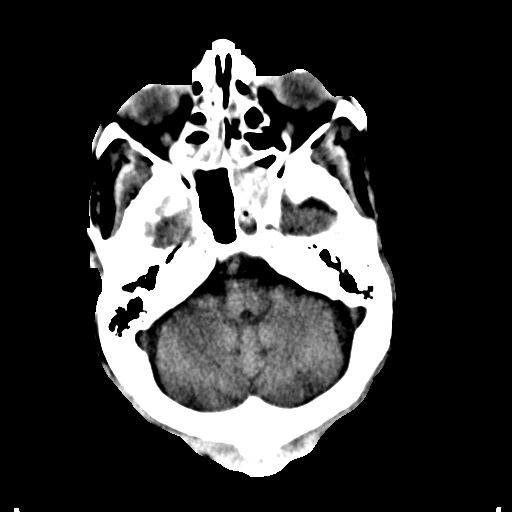
[im 9/31  brain]
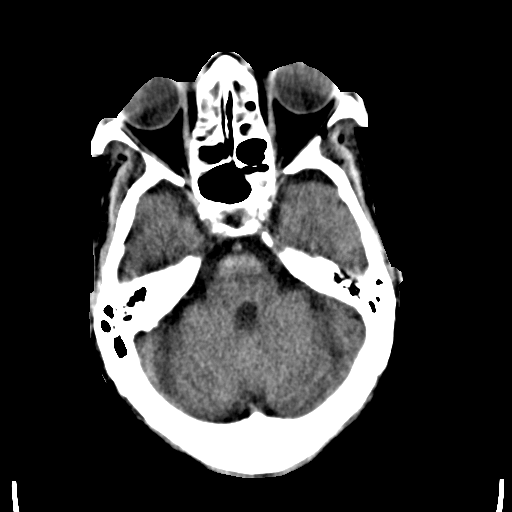
[im 11/31  brain]
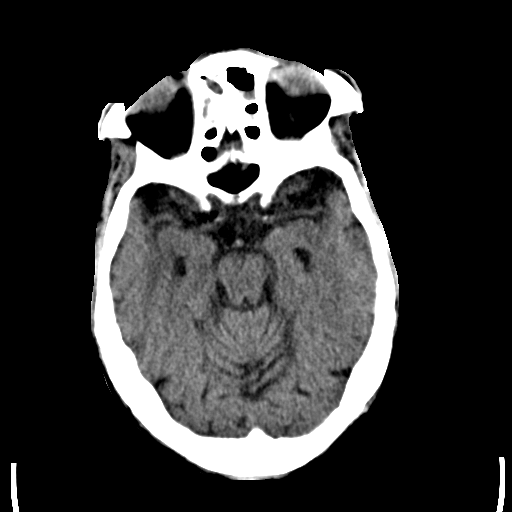
[im 11/31  bone]
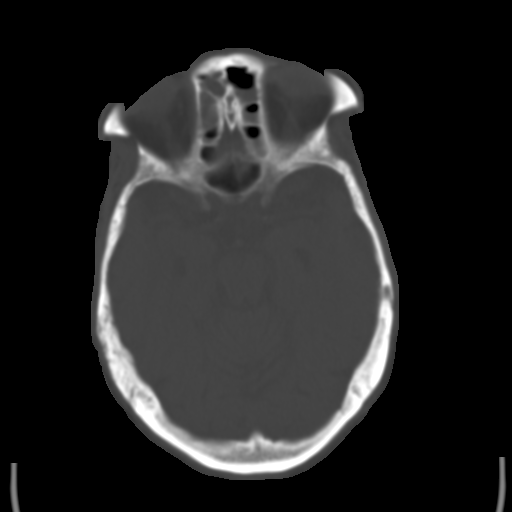
[im 13/31  brain]
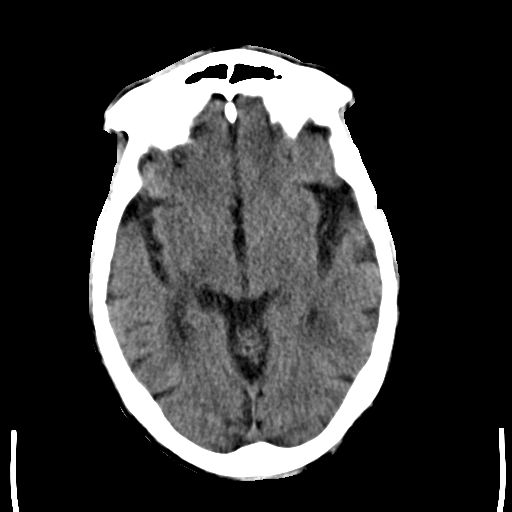
[im 16/31  brain]
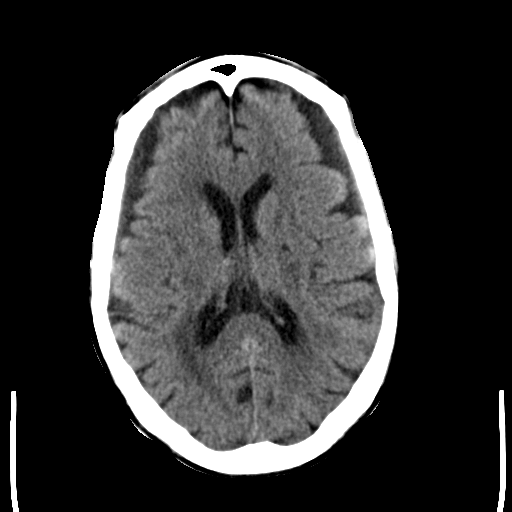
[im 18/31  brain]
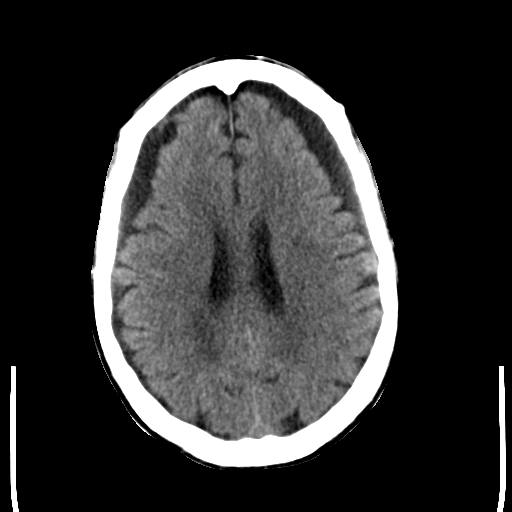
[im 20/31  brain]
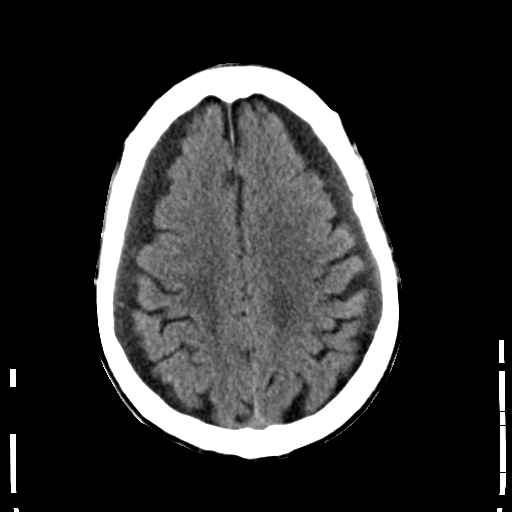
[im 20/31  bone]
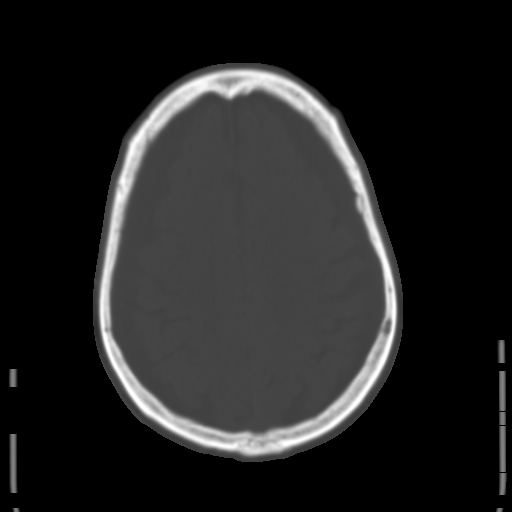
[im 22/31  brain]
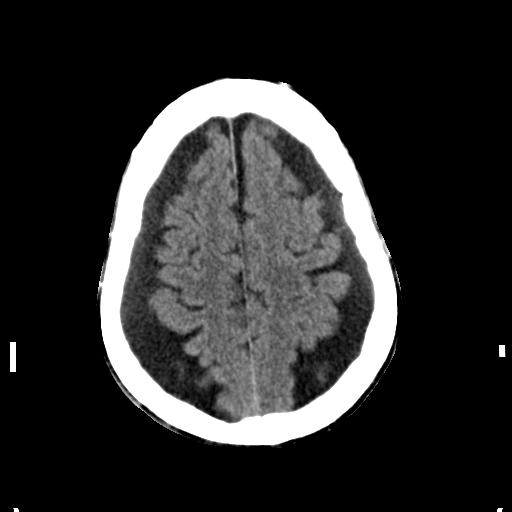
[im 24/31  brain]
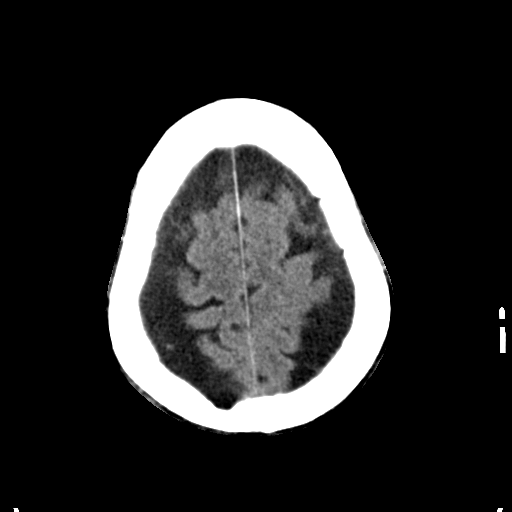
[im 26/31  brain]
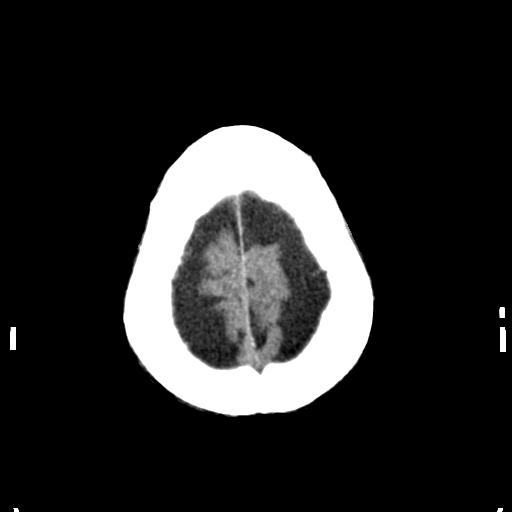
[im 28/31  brain]
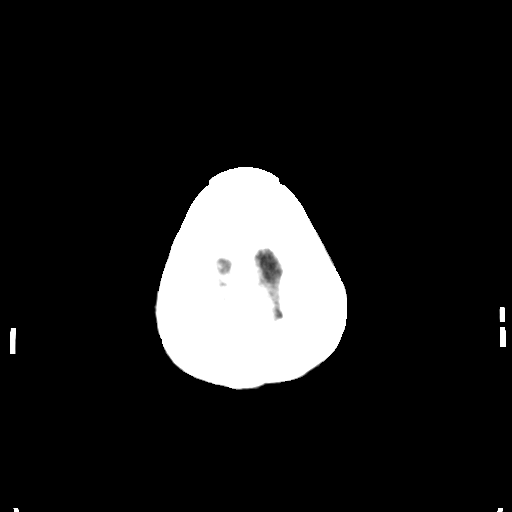
[im 28/31  bone]
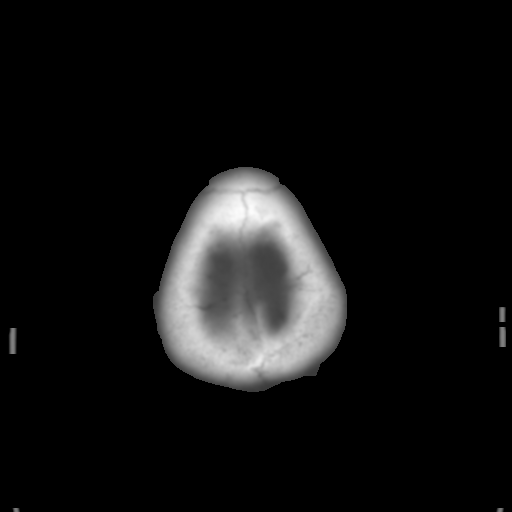

[Series 4: head wo recon · axial · 0.39mm/px · z∈[-79,-59]mm · 2 of 31 slices shown]
[im 3/31  brain]
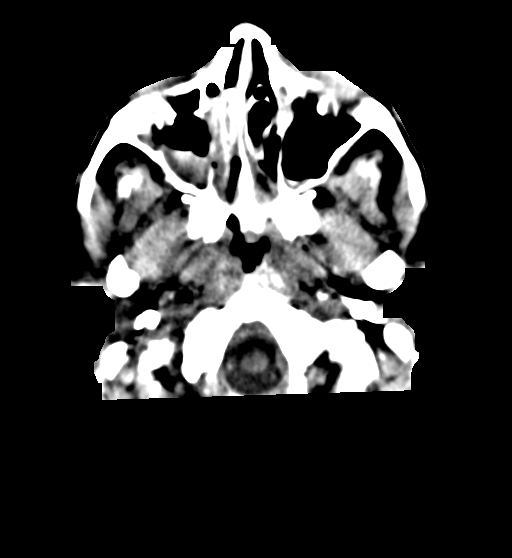
[im 7/31  brain]
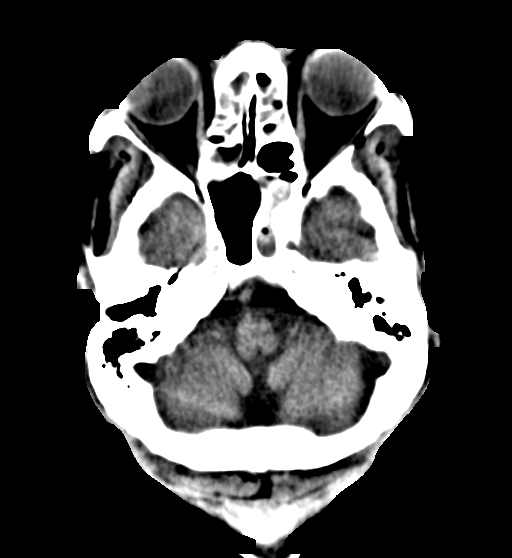

[15 of 30 positions shown; findings below may reference images not displayed]

FINDINGS: Right maxillary and bilateral ethmoid sinusitis is noted. Mild
diffuse cortical atrophy is again noted. Mild chronic ischemic white
matter disease is noted. Stable probable chronic bilateral subdural
hygromas are noted. No mass effect or midline shift is noted.
Ventricular size is within normal limits. There is no evidence of
mass lesion, acute hemorrhage or acute infarction.
IMPRESSION: Bilateral ethmoid and right maxillary sinusitis. Mild diffuse
cortical atrophy. Mild chronic ischemic white matter disease. Stable
probable chronic bilateral subdural hygromas. No significant change
compared to prior exam.
